# Patient Record
Sex: Male | Born: 1970 | Race: Black or African American | Hispanic: No | Marital: Married | Smoking: Never smoker
Health system: Southern US, Community
[De-identification: ages and names within clinical notes are randomized; demographics above are authoritative.]

## PROBLEM LIST (undated history)

## (undated) DIAGNOSIS — I10 Essential (primary) hypertension: Secondary | ICD-10-CM

## (undated) HISTORY — PX: BACK SURGERY: SHX140

## (undated) HISTORY — PX: OTHER SURGICAL HISTORY: SHX169

## (undated) HISTORY — PX: APPENDECTOMY: SHX54

## (undated) HISTORY — PX: ROTATOR CUFF REPAIR: SHX139

## (undated) HISTORY — PX: KNEE SURGERY: SHX244

---

## 2000-06-23 ENCOUNTER — Encounter: Payer: Self-pay | Admitting: Neurosurgery

## 2000-06-23 ENCOUNTER — Ambulatory Visit (HOSPITAL_COMMUNITY): Admission: RE | Admit: 2000-06-23 | Discharge: 2000-06-23 | Payer: Self-pay | Admitting: Neurosurgery

## 2001-05-20 ENCOUNTER — Encounter: Payer: Self-pay | Admitting: Neurosurgery

## 2001-05-21 ENCOUNTER — Inpatient Hospital Stay (HOSPITAL_COMMUNITY): Admission: RE | Admit: 2001-05-21 | Discharge: 2001-05-23 | Payer: Self-pay | Admitting: Neurosurgery

## 2001-05-21 ENCOUNTER — Encounter: Payer: Self-pay | Admitting: Neurosurgery

## 2001-06-13 ENCOUNTER — Encounter: Payer: Self-pay | Admitting: Neurosurgery

## 2001-06-13 ENCOUNTER — Encounter: Admission: RE | Admit: 2001-06-13 | Discharge: 2001-06-13 | Payer: Self-pay | Admitting: Neurosurgery

## 2002-05-27 ENCOUNTER — Ambulatory Visit (HOSPITAL_COMMUNITY): Admission: RE | Admit: 2002-05-27 | Discharge: 2002-05-27 | Payer: Self-pay | Admitting: Neurosurgery

## 2002-05-27 ENCOUNTER — Encounter: Payer: Self-pay | Admitting: Neurosurgery

## 2002-07-29 ENCOUNTER — Encounter: Payer: Self-pay | Admitting: Neurosurgery

## 2002-07-31 ENCOUNTER — Encounter: Payer: Self-pay | Admitting: Neurosurgery

## 2002-07-31 ENCOUNTER — Inpatient Hospital Stay (HOSPITAL_COMMUNITY): Admission: RE | Admit: 2002-07-31 | Discharge: 2002-08-07 | Payer: Self-pay | Admitting: Neurosurgery

## 2002-08-05 ENCOUNTER — Encounter: Payer: Self-pay | Admitting: Neurosurgery

## 2007-05-20 ENCOUNTER — Emergency Department (HOSPITAL_COMMUNITY): Admission: EM | Admit: 2007-05-20 | Discharge: 2007-05-20 | Payer: Self-pay | Admitting: Emergency Medicine

## 2008-03-18 ENCOUNTER — Emergency Department (HOSPITAL_COMMUNITY): Admission: EM | Admit: 2008-03-18 | Discharge: 2008-03-18 | Payer: Self-pay | Admitting: Emergency Medicine

## 2009-07-09 ENCOUNTER — Emergency Department (HOSPITAL_COMMUNITY): Admission: EM | Admit: 2009-07-09 | Discharge: 2009-07-09 | Payer: Self-pay | Admitting: Emergency Medicine

## 2011-01-04 LAB — CBC
Hemoglobin: 15.1 g/dL (ref 13.0–17.0)
MCV: 87.6 fL (ref 78.0–100.0)
RBC: 5.04 MIL/uL (ref 4.22–5.81)

## 2011-01-04 LAB — POCT CARDIAC MARKERS: Myoglobin, poc: 71.5 ng/mL (ref 12–200)

## 2011-01-04 LAB — DIFFERENTIAL
Eosinophils Relative: 1 % (ref 0–5)
Lymphocytes Relative: 23 % (ref 12–46)
Lymphs Abs: 1.3 10*3/uL (ref 0.7–4.0)
Monocytes Relative: 8 % (ref 3–12)
Neutro Abs: 4 10*3/uL (ref 1.7–7.7)
Neutrophils Relative %: 67 % (ref 43–77)

## 2011-01-04 LAB — COMPREHENSIVE METABOLIC PANEL
ALT: 20 U/L (ref 0–53)
AST: 20 U/L (ref 0–37)
Albumin: 4.5 g/dL (ref 3.5–5.2)
Alkaline Phosphatase: 61 U/L (ref 39–117)
BUN: 17 mg/dL (ref 6–23)
CO2: 25 mEq/L (ref 19–32)
Chloride: 106 mEq/L (ref 96–112)
Creatinine, Ser: 0.91 mg/dL (ref 0.4–1.5)
GFR calc Af Amer: >60 mL/min (ref >60)
Glucose, Bld: 101 mg/dL — ABNORMAL HIGH (ref 70–99)
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
## 2011-02-16 NOTE — Op Note (Signed)
[place]. [facility]  Patient:    TECUMSEH, YEAGLEY Visit Number: [id number] MRN: [id number]          Service Type: SUR Location: [id number] Attending Physician:  Emeterio Reeve Proc. Date: 05/21/01 Adm. Date:  05/21/2001                             Operative Report  PREOPERATIVE DIAGNOSIS:  Herniated disk at C4-5 on the left.  POSTOPERATIVE DIAGNOSIS:  Herniated disk at C4-5 on the left.  OPERATION PERFORMED:  C4-5 anterior cervical diskectomy and tether plate.  SURGEON:  Payton Doughty, M.D.  ASSISTANT:  Mena Goes. Franky Macho, M.D.  COMPLICATIONS:  None.  ANESTHESIA:  General endotracheal.  INDICATIONS FOR PROCEDURE:  The patient is a 40 year old right-handed black male with herniated disk at C4-C5.  DESCRIPTION OF PROCEDURE:  The patient was taken to the operating room, smoothly anesthetized and intubated, and placed supine in the halter head traction.  Following shave, prep and drape in the usual sterile fashion, the skin was incised in the midline to the medial border of the sternocleidomastoid muscle on the left side one fingerbreadth above the level of the carotid tubercle.  The platysma was identified and elevated, divided and undermined.  The sternocleidomastoid was identified.  Medial dissection revealed the carotid artery to retracted laterally to the left, trachea and esophagus retracted laterally divided exposing the bones of the anterior cervical spine.  Marker was placed and intraoperative x-ray obtained to confirm correctness of the level.  Having confirmed correctness of the level, diskectomy was carried out under gross observation.  The operating microscope was then brought in and used for microdissection technique.  The anterior epidural space was dissected, diskectomy performed in the anterior epidural space.  On the left side there was a large vein which was divided and packed and the left C5 nerve root was carefully dissected,  the offending disk particle removed.  The right side similar vein but now evidence of a large disk on that side.  Following complete diskectomy, a 7 mm bone graft was fashioned from patellar allograft and tapped into place.  The wound was irrigated and hemostasis assured.  A 12 mm tether plate was then placed with two 13 mm screws in C5, one 13 and one 12 mm screw in C4.  Intraoperative X-ray showed good placement of bone graft, plate and screws.  The wound was irrigated, hemostasis assured.  The platysma was reapproximated with 3-0 Vicryl in interrupted fashion and subcutaneous tissues were reapproximated with 3-0 Vicryl in interrupted fashion.  The skin was closed with 4-0 Vicryl in a running subcuticular fashion.  Benzoin and Steri-Strips were placed and made occlusive with Telfa and OpSite.  The patient was taken to the recovery room in good condition after being placed in an Aspen collar. Attending Physician:  Emeterio Reeve DD:  05/21/01 TD:  05/21/01 Job: [id number]

## 2011-02-16 NOTE — Discharge Summary (Signed)
   NAME:  Matthew [name], Matthew [name]                        ACCOUNT NO.:  [id number]   MEDICAL RECORD NO.:  [id number]                   PATIENT TYPE:  INP   LOCATION:  3011                                 FACILITY:  [facility]   PHYSICIAN:  Payton Doughty, M.D.                   DATE OF BIRTH:  [birth year]   DATE OF ADMISSION:  07/31/2002  DATE OF DISCHARGE:  08/07/2002                                 DISCHARGE SUMMARY   ADMISSION DIAGNOSIS:  Spondylolisthesis C3-4, herniated disk C5-6 and status  post C4-5 fusion.   PROCEDURE:  C3-4, C5-6 anterior cervicectomy and fusion.   COMPLICATIONS:  Dysphagia.   DISPOSITION:  Discharge status alive and well.   HISTORY OF PRESENT ILLNESS:  This is a 40 year old right-handed black  gentleman whose history and physical is recounted in the chart. He has had a  4-5 disk done. He developed transitional  syndrome to the 3-4 and  at 5-6  and is now admitted for fusion.   PAST MEDICAL HISTORY:  Unremarkable.   PHYSICAL EXAMINATION:  GENERAL: The exam was intact.  NEUROLOGIC:  Demonstrated mild C6 radiculopathy.   HOSPITAL COURSE:  He was admitted after ascertaining normal laboratory  values and underwent removal of the 4-5 plate and fusion at the C3-4 and C5-  6 and plating from C3 to C6. Postoperatively he has done reasonably well and  a drain  was left in for two days. He had some dysphagia from phalangeal  swelling after that. He responded well to a short burst of steroids.   As time passed he has been able to drink normally. He did have an IV  restarted to prevent dehydration for several days. He is now up eating and  voiding normally. His voice is good. His airway is OK. Swallowing has  returned to near normal. Lateral C-spine x-ray showed some swelling but  intact. No evidence of free air. He is now being discharged home in the care  of his family.   DISCHARGE MEDICATIONS:  Percocet p.r.n. pain.   FOLLOW UP:  Followup will be in the  [facility]  office in about a week with another lateral C-spine x-ray.                                               Payton Doughty, M.D.    MWR/MEDQ  D:  08/07/2002  T:  08/08/2002  Job:  [id number]

## 2011-02-16 NOTE — Op Note (Signed)
NAME:  Matthew [name], Matthew [name] NO.:  [id number]   MEDICAL RECORD NO.:  [id number]                   PATIENT TYPE:  INP   LOCATION:  2871                                 FACILITY:  [facility]   PHYSICIAN:  Payton Doughty, M.D.                   DATE OF BIRTH:  1971-09-25   DATE OF PROCEDURE:  07/31/2002  DATE OF DISCHARGE:                                 OPERATIVE REPORT   PREOPERATIVE DIAGNOSES:  Spondylosis C3-4,  herniated disc at C5-C6.   POSTOPERATIVE DIAGNOSES:  Spondylosis C3-4,  herniated disc at C5-C6.   OPERATION PERFORMED:  C3-4 and C5-6 anterior cervical diskectomy and fusion  with a tether plate removing Y8-6 tether plate and anterior arthrodesis from  C3 to C6.   SURGEON:  Payton Doughty, M.D.   ANESTHESIA:  General endotracheal.   PREP:  Sterile Betadine prep and scrub with alcohol wipe.   ASSISTANT:  1. Danae Orleans. Venetia Maxon, M.D.  2. [place].   INDICATIONS FOR PROCEDURE:  The patient is a 40 year old gentleman who has  had a previous fusion at C4-C5 and now has spondylitic disease at 3-4 and a  herniated disk at C5-C6.   DESCRIPTION OF PROCEDURE:  The patient was taken to the operating room,  smoothly anesthetized and intubated and placed supine on the operating table  in the halter head traction.  Following shave, prep and draped in the usual  sterile fashion.  The skin was incised paralleling the sternocleidomastoid  muscle for a distance of approximately 8 cm, starting at the level of the  carotid tubercle.  The platysma was identified, elevated, divided and  undermined.  The sternocleidomastoid muscle was identified and medial  dissection revealed the carotid artery retracted laterally to the left, the  trachea and esophagus dissected free and retracted laterally to the right.  This exposed the bones of the anterior cervical spine and the plate.  Using  the plate as a marker, the longus colli was taken down from C6 up through C3  bilaterally  and the Shadowline retractor placed transversely in a  cephalocaudal direction.  Diskectomy was then carried out at  C3-4 and C5-6  first under gross observation.  The operating microscope was then brought in  and we used microdissection technique to remove the posterior longitudinal  ligament dissecting into the anterior epidural space and ensured  decompression of the nerve roots.  At C3-4 there was mostly spondylitic  disease that was a little bit worse on the left than on the right but  complete decompression was obtained.  At C5-6 there was a herniated disk  centrally and to the right which was removed without difficulty.  At each  both neural foramina were completely decompressed.  Epidural bleeding was  controlled with thrombin soaked Gelfoam on the right side at C3-C4.  Having  completed decompression, patellar bone graft was then fashioned and tapped  into place.  The wound was irrigated and hemostasis assured.  A four level  55 mm tether plate was then placed with 13 mm screws, two in C3, one in C4,  one in C5 and two in C6.  Intraoperative x-ray showed good placement of bone  grafts, plate and screws.  The wound was irrigated and hemostasis assured.  A Jackson-Pratt drain was placed in the posterior cervical space and exited  by separate incision, secured there with 3-0 Vicryl.  The platysma was  reapproximated with 3-0 Vicryl in interrupted fashion, subcutaneous tissues  reapproximated with 3-0  Vicryl in interrupted fashion and the skin was closed with 4-0 Vicryl in  running subcuticular fashion.  Benzoin and Steri-Strips were placed and made  occlusive with Telfa and OpSite.  The patient was placed in an Aspen collar  and returned to the recovery room in good condition.                                                 Payton Doughty, M.D.    MWR/MEDQ  D:  07/31/2002  T:  07/31/2002  Job:  [id number]
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
## 2012-12-16 ENCOUNTER — Emergency Department (HOSPITAL_COMMUNITY): Payer: BC Managed Care – PPO

## 2012-12-16 ENCOUNTER — Encounter (HOSPITAL_COMMUNITY): Payer: Self-pay

## 2012-12-16 ENCOUNTER — Emergency Department (HOSPITAL_COMMUNITY)
Admission: EM | Admit: 2012-12-16 | Discharge: 2012-12-16 | Disposition: A | Discharge status: Home / Self Care | Payer: BC Managed Care – PPO | Attending: Emergency Medicine | Admitting: Emergency Medicine

## 2012-12-16 DIAGNOSIS — S40012A Contusion of left shoulder, initial encounter: Secondary | ICD-10-CM

## 2012-12-16 DIAGNOSIS — Y9389 Activity, other specified: Secondary | ICD-10-CM | POA: Insufficient documentation

## 2012-12-16 DIAGNOSIS — S161XXA Strain of muscle, fascia and tendon at neck level, initial encounter: Secondary | ICD-10-CM

## 2012-12-16 DIAGNOSIS — S4980XA Other specified injuries of shoulder and upper arm, unspecified arm, initial encounter: Secondary | ICD-10-CM | POA: Insufficient documentation

## 2012-12-16 DIAGNOSIS — S46909A Unspecified injury of unspecified muscle, fascia and tendon at shoulder and upper arm level, unspecified arm, initial encounter: Secondary | ICD-10-CM | POA: Insufficient documentation

## 2012-12-16 DIAGNOSIS — S8990XA Unspecified injury of unspecified lower leg, initial encounter: Secondary | ICD-10-CM | POA: Insufficient documentation

## 2012-12-16 DIAGNOSIS — Y92410 Unspecified street and highway as the place of occurrence of the external cause: Secondary | ICD-10-CM | POA: Insufficient documentation

## 2012-12-16 DIAGNOSIS — S40019A Contusion of unspecified shoulder, initial encounter: Secondary | ICD-10-CM | POA: Insufficient documentation

## 2012-12-16 DIAGNOSIS — S139XXA Sprain of joints and ligaments of unspecified parts of neck, initial encounter: Secondary | ICD-10-CM | POA: Insufficient documentation

## 2012-12-16 DIAGNOSIS — S0990XA Unspecified injury of head, initial encounter: Secondary | ICD-10-CM | POA: Insufficient documentation

## 2012-12-16 DIAGNOSIS — I10 Essential (primary) hypertension: Secondary | ICD-10-CM | POA: Insufficient documentation

## 2012-12-16 DIAGNOSIS — Z79899 Other long term (current) drug therapy: Secondary | ICD-10-CM | POA: Insufficient documentation

## 2012-12-16 HISTORY — DX: Essential (primary) hypertension: I10

## 2012-12-16 LAB — COMPREHENSIVE METABOLIC PANEL
Alkaline Phosphatase: 65 U/L (ref 39–117)
Calcium: 9.4 mg/dL (ref 8.4–10.5)
GFR calc Af Amer: >90 mL/min (ref >90)
GFR calc non Af Amer: >90 mL/min (ref >90)
Glucose, Bld: 99 mg/dL (ref 70–99)
Sodium: 140 mEq/L (ref 135–145)
Total Bilirubin: 0.4 mg/dL (ref 0.3–1.2)
Total Protein: 7 g/dL (ref 6.0–8.3)

## 2012-12-16 LAB — CBC WITH DIFFERENTIAL/PLATELET
Basophils Relative: 0 % (ref 0–1)
Eosinophils Relative: 1 % (ref 0–5)
Hemoglobin: 14.5 g/dL (ref 13.0–17.0)
MCH: 29.4 pg (ref 26.0–34.0)
MCHC: 35 g/dL (ref 30.0–36.0)
Monocytes Absolute: 0.5 10*3/uL (ref 0.1–1.0)
Monocytes Relative: 9 % (ref 3–12)
RBC: 4.93 MIL/uL (ref 4.22–5.81)
RDW: 13.2 % (ref 11.5–15.5)

## 2012-12-16 LAB — URINALYSIS, ROUTINE W REFLEX MICROSCOPIC
Bilirubin Urine: NEGATIVE
Glucose, UA: NEGATIVE mg/dL
Hgb urine dipstick: NEGATIVE
Ketones, ur: NEGATIVE mg/dL
Leukocytes, UA: NEGATIVE
Nitrite: NEGATIVE
Protein, ur: NEGATIVE mg/dL
Specific Gravity, Urine: 1.01 (ref 1.005–1.030)
Urobilinogen, UA: 0.2 mg/dL (ref 0.0–1.0)
pH: 8 (ref 5.0–8.0)

## 2012-12-16 MED ORDER — TRAMADOL HCL 50 MG PO TABS: 50.0000 mg | ORAL_TABLET | Freq: Four times a day (QID) | ORAL | Status: DC | PRN

## 2012-12-16 MED ORDER — IOHEXOL 300 MG/ML  SOLN
100.0000 mL | Freq: Once | INTRAMUSCULAR | Status: AC | PRN
  Administered 2012-12-16: 100 mL via INTRAVENOUS
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
## 2012-12-16 NOTE — ED Notes (Signed)
Advised patient we needed urine specimen. 
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
## 2012-12-16 NOTE — ED Notes (Signed)
Left in c/o family for transport home; alert, oriented, in no apparent distress; instructions, prescriptions and f/u information given/reviewed - verbalizes understanding.
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
## 2016-08-24 ENCOUNTER — Emergency Department (HOSPITAL_COMMUNITY)
Admission: EM | Admit: 2016-08-24 | Discharge: 2016-08-25 | Disposition: A | Discharge status: Home / Self Care | Payer: BC Managed Care – PPO | Attending: Emergency Medicine | Admitting: Emergency Medicine

## 2016-08-24 ENCOUNTER — Encounter (HOSPITAL_COMMUNITY): Payer: Self-pay

## 2016-08-24 DIAGNOSIS — I10 Essential (primary) hypertension: Secondary | ICD-10-CM | POA: Diagnosis not present

## 2016-08-24 DIAGNOSIS — S4992XA Unspecified injury of left shoulder and upper arm, initial encounter: Secondary | ICD-10-CM | POA: Diagnosis present

## 2016-08-24 DIAGNOSIS — Y999 Unspecified external cause status: Secondary | ICD-10-CM | POA: Insufficient documentation

## 2016-08-24 DIAGNOSIS — S0990XA Unspecified injury of head, initial encounter: Secondary | ICD-10-CM | POA: Insufficient documentation

## 2016-08-24 DIAGNOSIS — T148XXA Other injury of unspecified body region, initial encounter: Secondary | ICD-10-CM | POA: Insufficient documentation

## 2016-08-24 DIAGNOSIS — R1012 Left upper quadrant pain: Secondary | ICD-10-CM | POA: Diagnosis not present

## 2016-08-24 DIAGNOSIS — Y92410 Unspecified street and highway as the place of occurrence of the external cause: Secondary | ICD-10-CM | POA: Diagnosis not present

## 2016-08-24 DIAGNOSIS — Y9389 Activity, other specified: Secondary | ICD-10-CM | POA: Insufficient documentation

## 2016-08-24 DIAGNOSIS — R1033 Periumbilical pain: Secondary | ICD-10-CM | POA: Diagnosis not present

## 2016-08-24 DIAGNOSIS — M542 Cervicalgia: Secondary | ICD-10-CM | POA: Insufficient documentation

## 2016-08-24 DIAGNOSIS — M549 Dorsalgia, unspecified: Secondary | ICD-10-CM | POA: Insufficient documentation

## 2016-08-24 DIAGNOSIS — J398 Other specified diseases of upper respiratory tract: Secondary | ICD-10-CM | POA: Insufficient documentation

## 2016-08-24 DIAGNOSIS — Z79899 Other long term (current) drug therapy: Secondary | ICD-10-CM | POA: Insufficient documentation

## 2016-08-24 DIAGNOSIS — T07XXXA Unspecified multiple injuries, initial encounter: Secondary | ICD-10-CM

## 2016-08-24 MED ORDER — HYDROCODONE-ACETAMINOPHEN 5-325 MG PO TABS
2.0000 | ORAL_TABLET | Freq: Once | ORAL | Status: AC
  Administered 2016-08-25: 2 via ORAL
  Filled 2016-08-24: qty 2

## 2016-08-24 MED ORDER — IBUPROFEN 800 MG PO TABS
800.0000 mg | ORAL_TABLET | Freq: Once | ORAL | Status: AC
  Administered 2016-08-25: 800 mg via ORAL
  Filled 2016-08-24: qty 1
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
## 2016-08-25 ENCOUNTER — Emergency Department (HOSPITAL_COMMUNITY): Payer: BC Managed Care – PPO

## 2016-08-25 DIAGNOSIS — T148XXA Other injury of unspecified body region, initial encounter: Secondary | ICD-10-CM | POA: Diagnosis not present

## 2016-08-25 LAB — I-STAT CHEM 8, ED
BUN: 17 mg/dL (ref 6–20)
CALCIUM ION: 1.17 mmol/L (ref 1.15–1.40)
CHLORIDE: 104 mmol/L (ref 101–111)
Creatinine, Ser: 1 mg/dL (ref 0.61–1.24)
GLUCOSE: 123 mg/dL — AB (ref 65–99)
HCT: 46 % (ref 39.0–52.0)
HEMOGLOBIN: 15.6 g/dL (ref 13.0–17.0)
POTASSIUM: 3.9 mmol/L (ref 3.5–5.1)
SODIUM: 141 mmol/L (ref 135–145)
TCO2: 25 mmol/L (ref 0–100)

## 2016-08-25 MED ORDER — IBUPROFEN 800 MG PO TABS: 800.0000 mg | ORAL_TABLET | Freq: Three times a day (TID) | ORAL | 0 refills | Status: DC

## 2016-08-25 MED ORDER — IOPAMIDOL (ISOVUE-300) INJECTION 61%
100.0000 mL | Freq: Once | INTRAVENOUS | Status: AC | PRN
  Administered 2016-08-25: 100 mL via INTRAVENOUS

## 2016-08-25 NOTE — Discharge Instructions (Signed)
Your testing is negative for serious injury. Take ibuprofen as needed for aches and pains. Followup with your doctor. Return to the ED if you develop new or worsening symptoms.

## 2016-08-25 NOTE — ED Notes (Signed)
Ambulated patient around nurses station with no issues.

## 2018-12-02 ENCOUNTER — Encounter (HOSPITAL_COMMUNITY): Payer: Self-pay | Admitting: Emergency Medicine

## 2018-12-02 ENCOUNTER — Emergency Department (HOSPITAL_COMMUNITY)
Admission: EM | Admit: 2018-12-02 | Discharge: 2018-12-02 | Disposition: A | Discharge status: Home / Self Care | Payer: No Typology Code available for payment source | Attending: Emergency Medicine | Admitting: Emergency Medicine

## 2018-12-02 ENCOUNTER — Emergency Department (HOSPITAL_COMMUNITY): Payer: No Typology Code available for payment source

## 2018-12-02 ENCOUNTER — Other Ambulatory Visit: Payer: Self-pay

## 2018-12-02 DIAGNOSIS — S46912A Strain of unspecified muscle, fascia and tendon at shoulder and upper arm level, left arm, initial encounter: Secondary | ICD-10-CM | POA: Diagnosis not present

## 2018-12-02 DIAGNOSIS — I10 Essential (primary) hypertension: Secondary | ICD-10-CM | POA: Insufficient documentation

## 2018-12-02 DIAGNOSIS — Z79899 Other long term (current) drug therapy: Secondary | ICD-10-CM | POA: Insufficient documentation

## 2018-12-02 DIAGNOSIS — Y9389 Activity, other specified: Secondary | ICD-10-CM | POA: Diagnosis not present

## 2018-12-02 DIAGNOSIS — Y929 Unspecified place or not applicable: Secondary | ICD-10-CM | POA: Diagnosis not present

## 2018-12-02 DIAGNOSIS — M542 Cervicalgia: Secondary | ICD-10-CM | POA: Diagnosis not present

## 2018-12-02 DIAGNOSIS — W208XXA Other cause of strike by thrown, projected or falling object, initial encounter: Secondary | ICD-10-CM | POA: Insufficient documentation

## 2018-12-02 DIAGNOSIS — Y990 Civilian activity done for income or pay: Secondary | ICD-10-CM | POA: Insufficient documentation

## 2018-12-02 DIAGNOSIS — S4992XA Unspecified injury of left shoulder and upper arm, initial encounter: Secondary | ICD-10-CM | POA: Diagnosis present

## 2018-12-02 MED ORDER — HYDROCODONE-ACETAMINOPHEN 5-325 MG PO TABS: 2.0000 | ORAL_TABLET | ORAL | 0 refills | Status: DC | PRN

## 2018-12-02 MED ORDER — METHOCARBAMOL 500 MG PO TABS: 500.0000 mg | ORAL_TABLET | Freq: Two times a day (BID) | ORAL | 0 refills | Status: DC

## 2018-12-02 MED ORDER — PREDNISONE 10 MG PO TABS: ORAL_TABLET | ORAL | 0 refills | Status: DC

## 2018-12-02 NOTE — ED Provider Notes (Signed)
[facility] EMERGENCY DEPARTMENT Provider Note   CSN: [id number] Arrival date & time: 12/02/18  1051    History   Chief Complaint Chief Complaint  Patient presents with  . Shoulder Pain    HPI Matthew [name] is a 48 y.o. male.     The history is provided by the patient. No language interpreter was used.  Shoulder Pain  Location:  Shoulder Shoulder location:  L shoulder Injury: yes   Associated symptoms: neck pain     Past Medical History:  Diagnosis Date  . Hypertension     There are no active problems to display for this patient.   Past Surgical History:  Procedure Laterality Date  . APPENDECTOMY    . BACK SURGERY    . KNEE SURGERY    . ROTATOR CUFF REPAIR    . thumb surgery          Home Medications    Prior to Admission medications   Medication Sig Start Date End Date Taking? Authorizing Provider  celecoxib (CELEBREX) 200 MG capsule Take 200 mg by mouth daily.     [provider]  ibuprofen (ADVIL,MOTRIN) 800 MG tablet Take 1 tablet (800 mg total) by mouth 3 (three) times daily. 08/25/16   Rancour, Jeannett Senior, MD  UNKNOWN TO PATIENT Take 1 tablet by mouth daily. For blood pressure-name is unknown (may start with "M')    [provider]    Family History History reviewed. No pertinent family history.  Social History Social History   Tobacco Use  . Smoking status: Never Smoker  . Smokeless tobacco: Never Used  Substance Use Topics  . Alcohol use: No  . Drug use: No     Allergies   Other   Review of Systems Review of Systems  Musculoskeletal: Positive for arthralgias, myalgias and neck pain.  All other systems reviewed and are negative.    Physical Exam Updated Vital Signs BP (!) 155/100 (BP Location: Right Arm)   Pulse 78   Temp 97.9 F (36.6 C) (Temporal)   Resp 18   Ht 6' (1.829 m)   Wt 102.1 kg   SpO2 95%   BMI 30.52 kg/m   Physical Exam Vitals signs reviewed.  HENT:     Head: Normocephalic.  Neck:      Musculoskeletal: Normal range of motion. Muscular tenderness present.  Cardiovascular:     Rate and Rhythm: Normal rate.     Pulses: Normal pulses.  Pulmonary:     Effort: Pulmonary effort is normal.  Musculoskeletal:     Comments: c spine tender left lateral neck,  Tender left shoulder, pain with movement.   Skin:    General: Skin is warm.  Neurological:     General: No focal deficit present.     Mental Status: He is alert.  Psychiatric:        Mood and Affect: Mood normal.      ED Treatments / Results  Labs (all labs ordered are listed, but only abnormal results are displayed) Labs Reviewed - No data to display  EKG None  Radiology Dg Cervical Spine Complete  Result Date: 12/02/2018 CLINICAL DATA:  Fall with injury to neck with left shoulder and neck pain. EXAM: CERVICAL SPINE - COMPLETE 4+ VIEW COMPARISON:  CT 08/25/2016 FINDINGS: Vertebral body alignment and heights are normal. Mild spondylosis of the cervical spine. Anterior fusion hardware is present and intact from C3-C6. Interbody fusion from C3-C6. Mild left-sided neural foraminal narrowing at the C3-4 level  and C6-7 level. Mild uncovertebral joint spurring and facet arthropathy is present. Atlantoaxial articulation is normal. Prevertebral soft tissues are unremarkable IMPRESSION: No acute findings. Mild spondylosis of the cervical spine. Anterior fusion hardware intact unchanged from C3-C6 with interbody fusion from C3-C6. Mild left-sided neural foraminal narrowing at the C3-4 and C6-7 levels. Electronically Signed   By: Elberta Fortis M.D.   On: 12/02/2018 13:29   Dg Shoulder Left  Result Date: 12/02/2018 CLINICAL DATA:  Fall with left shoulder pain. EXAM: LEFT SHOULDER - 2+ VIEW COMPARISON:  12/16/2012 and chest x-ray 08/25/2016 FINDINGS: Subtle early degenerative change over the glenohumeral joint. Minimal degenerative change over the [facility] joint. Prominent downgoing spur over the distal acromion unchanged. Stable  deformity of the upper left bony thorax. IMPRESSION: No acute findings. Mild degenerate changes of the left shoulder. Prominent downgoing spur over the a chromium unchanged. Electronically Signed   By: Elberta Fortis M.D.   On: 12/02/2018 13:31    Procedures Procedures (including critical care time)  Medications Ordered in ED Medications - No data to display   Initial Impression / Assessment and Plan / ED Course  I have reviewed the triage vital signs and the nursing notes.  Pertinent labs & imaging results that were available during my care of the patient were reviewed by me and considered in my medical decision making (see chart for details).        MDM  xrays reviewed   Final Clinical Impressions(s) / ED Diagnoses   Final diagnoses:  Strain of left shoulder, initial encounter  Neck pain on left side  Neck pain    ED Discharge Orders         Ordered    predniSONE (DELTASONE) 10 MG tablet     12/02/18 1429    methocarbamol (ROBAXIN) 500 MG tablet  2 times daily     12/02/18 1429    HYDROcodone-acetaminophen (NORCO/VICODIN) 5-325 MG tablet  Every 4 hours PRN     12/02/18 1429        An After Visit Summary was printed and given to the patient.    Elson Areas, PA-C 12/02/18 1434    Samuel Jester, DO 12/06/18 1845

## 2018-12-02 NOTE — ED Triage Notes (Signed)
Pt states he was getting a box of napkins off a higher shelf when it fell pushing his neck to the side. Now having L shoulder and neck pain.

## 2018-12-08 ENCOUNTER — Encounter (HOSPITAL_COMMUNITY): Payer: Self-pay | Admitting: Emergency Medicine

## 2018-12-08 ENCOUNTER — Telehealth: Payer: Self-pay | Admitting: Orthopaedic Surgery

## 2018-12-08 ENCOUNTER — Other Ambulatory Visit: Payer: Self-pay

## 2018-12-08 ENCOUNTER — Emergency Department (HOSPITAL_COMMUNITY)
Admission: EM | Admit: 2018-12-08 | Discharge: 2018-12-08 | Disposition: A | Discharge status: Home / Self Care | Payer: No Typology Code available for payment source | Attending: Emergency Medicine | Admitting: Emergency Medicine

## 2018-12-08 DIAGNOSIS — Z760 Encounter for issue of repeat prescription: Secondary | ICD-10-CM | POA: Insufficient documentation

## 2018-12-08 DIAGNOSIS — Z7982 Long term (current) use of aspirin: Secondary | ICD-10-CM | POA: Diagnosis not present

## 2018-12-08 DIAGNOSIS — M5412 Radiculopathy, cervical region: Secondary | ICD-10-CM | POA: Insufficient documentation

## 2018-12-08 DIAGNOSIS — M542 Cervicalgia: Secondary | ICD-10-CM | POA: Diagnosis present

## 2018-12-08 DIAGNOSIS — Z79899 Other long term (current) drug therapy: Secondary | ICD-10-CM | POA: Insufficient documentation

## 2018-12-08 DIAGNOSIS — I10 Essential (primary) hypertension: Secondary | ICD-10-CM | POA: Insufficient documentation

## 2018-12-08 DIAGNOSIS — M62838 Other muscle spasm: Secondary | ICD-10-CM | POA: Diagnosis not present

## 2018-12-08 MED ORDER — HYDROCODONE-ACETAMINOPHEN 5-325 MG PO TABS: 1.0000 | ORAL_TABLET | ORAL | 0 refills | Status: DC | PRN

## 2018-12-08 MED ORDER — METHOCARBAMOL 750 MG PO TABS: 750.0000 mg | ORAL_TABLET | Freq: Four times a day (QID) | ORAL | 0 refills | Status: DC

## 2018-12-08 MED ORDER — IBUPROFEN 800 MG PO TABS: 800.0000 mg | ORAL_TABLET | Freq: Three times a day (TID) | ORAL | 0 refills | Status: DC

## 2018-12-08 NOTE — ED Provider Notes (Signed)
[facility] EMERGENCY DEPARTMENT Provider Note   CSN: [id number] Arrival date & time: 12/08/18  1104    History   Chief Complaint Chief Complaint  Patient presents with  . Medication Refill    HPI Matthew [name] is a 48 y.o. male with a recent history of left neck and shoulder injury occurring at work when a heavy box fell, landing in his shoulder causing a rightward whiplash type injury to his neck.  He reports history of prior cervical fusions by Dr. Channing Mutters and is trying to obtain office follow up with him, but is complicated by workmans comp issues and that Dr Channing Mutters is retiring soon.  The patient has complaint of increasing pain since completing his hydrocodone he received here at his initial evaluation of this injury.  He reports waking with numbness radiating into his left fingertips most most morning with intermittent episodes of numbness throughout the day and painful range of motion of his neck and shoulder along with persistent swelling along his left neck and shoulder. He denies weakness in the left hand or arm.     The history is provided by the patient.    Past Medical History:  Diagnosis Date  . Hypertension     There are no active problems to display for this patient.   Past Surgical History:  Procedure Laterality Date  . APPENDECTOMY    . BACK SURGERY    . KNEE SURGERY    . ROTATOR CUFF REPAIR    . thumb surgery          Home Medications    Prior to Admission medications   Medication Sig Start Date End Date Taking? Authorizing Provider  aspirin EC 81 MG tablet Take 81 mg by mouth daily.   Yes [provider]  metoprolol succinate (TOPROL-XL) 25 MG 24 hr tablet Take 25 mg by mouth daily. Take 1 tablet po daily.   Yes [provider]  predniSONE (DELTASONE) 10 MG tablet 6,5,4,3,2,1 taper 12/02/18  Yes Elson Areas, PA-C  HYDROcodone-acetaminophen (NORCO/VICODIN) 5-325 MG tablet Take 1 tablet by mouth every 4 (four) hours as needed. 12/08/18    Burgess Amor, PA-C  ibuprofen (ADVIL,MOTRIN) 800 MG tablet Take 1 tablet (800 mg total) by mouth 3 (three) times daily. 12/08/18   Burgess Amor, PA-C  methocarbamol (ROBAXIN-750) 750 MG tablet Take 1 tablet (750 mg total) by mouth 4 (four) times daily. 12/08/18   Burgess Amor, PA-C    Family History No family history on file.  Social History Social History   Tobacco Use  . Smoking status: Never Smoker  . Smokeless tobacco: Never Used  Substance Use Topics  . Alcohol use: No  . Drug use: No     Allergies   Other   Review of Systems Review of Systems  Constitutional: Negative for fever.  Musculoskeletal: Positive for arthralgias and joint swelling. Negative for myalgias.  Neurological: Positive for numbness. Negative for weakness and headaches.     Physical Exam Updated Vital Signs BP (!) 150/84   Pulse 65   Temp 98.4 F (36.9 C) (Oral)   Resp 18   Ht 6' (1.829 m)   Wt 102 kg   SpO2 100%   BMI 30.50 kg/m   Physical Exam Constitutional:      Appearance: He is well-developed.  HENT:     Head: Atraumatic.  Neck:     Musculoskeletal: Muscular tenderness present.  Cardiovascular:     Comments: Pulses equal bilaterally Musculoskeletal:  General: Tenderness present.     Cervical back: He exhibits decreased range of motion, tenderness and spasm.       Back:     Comments: ttp and spasm across left trapezius  Skin:    General: Skin is warm and dry.  Neurological:     Mental Status: He is alert.     Sensory: Sensory deficit present.     Deep Tendon Reflexes: Reflexes normal.     Reflex Scores:      Bicep reflexes are 2+ on the right side and 2+ on the left side.    Comments: Decreased sensation to sharp touchulnar distribution of hand and lateral forearm. Grip strength 5/5 right , 4.5/5 left.    thumb/finger coordination intact.  Strength 5/5 wrist and elbow ext/flex.   ED Treatments / Results  Labs (all labs ordered are listed, but only abnormal results  are displayed) Labs Reviewed - No data to display  EKG None  Radiology No results found.  Procedures Procedures (including critical care time)  Medications Ordered in ED Medications - No data to display   Initial Impression / Assessment and Plan / ED Course  I have reviewed the triage vital signs and the nursing notes.  Pertinent labs & imaging results that were available during my care of the patient were reviewed by me and considered in my medical decision making (see chart for details).        Review of prior visit and imaging. No indication for emergent MRI imaging today, but discussed with pt that he may need this test if sx do not improve with time and continued tx.  He was placed on ibu, as just completed pred taper. Robaxin, hydrocodone. Referral to Dr. Channing Mutters for further management (pt called his office while here and has arranged f/u).   Pt seen by Dr. Jodi Mourning  Prior to dc home.  Final Clinical Impressions(s) / ED Diagnoses   Final diagnoses:  Cervical radiculopathy due to trauma  Muscle spasm    ED Discharge Orders         Ordered    ibuprofen (ADVIL,MOTRIN) 800 MG tablet  3 times daily     12/08/18 1432    methocarbamol (ROBAXIN-750) 750 MG tablet  4 times daily     12/08/18 1432    HYDROcodone-acetaminophen (NORCO/VICODIN) 5-325 MG tablet  Every 4 hours PRN     12/08/18 1432           Burgess Amor, PA-C 12/10/18 1001    Blane Ohara, MD 12/11/18 [phone]

## 2018-12-08 NOTE — ED Notes (Signed)
ED Provider at bedside. 

## 2018-12-08 NOTE — Telephone Encounter (Signed)
Call received this afternoon, 12/08/18, from workers Designer, industrial/product at [employer], [phone]. Requests appointment for patient following emergency room visit at [facility] 12/02/18 for work-related injury, shoulder strain. I called back to adjuster and requested fax#, in order to send our worker's comp set up form. Upon form completed and received, we will schedule appointment.

## 2018-12-08 NOTE — ED Triage Notes (Signed)
Pt would like refill on pain medications he received on 03/03.  He is not able to get in to a Dr d/t his workers comp claim has not gone through yet.

## 2018-12-08 NOTE — Discharge Instructions (Addendum)
T Do not drive within 4 hours of taking hydrocodone as this will make you drowsy.  Avoid lifting,  Bending,  Twisting or any other activity that worsens your pain over the next week.  Apply a heating pad to your neck and left shoulder area for 20 minutes several times daily.  This may help improve the muscle spasm.  You should get rechecked if your symptoms are not improving or you develop increased pain, weakness or loss of function in your left arm or hand - this is a sign of a worsening condition.

## 2018-12-09 NOTE — Telephone Encounter (Signed)
Spoke with patient and with adjuster - claim information verified, regarding appointment; discussed tomorrow - adjuster asked for next available after tomorrow; therefore, scheduled Friday, 12/12/2018, 11:00am; aware. (contact information: [facility], [id number], [place], [place]  [address], ph# [phone] / fax# [phone]

## 2018-12-12 ENCOUNTER — Encounter: Payer: Self-pay | Admitting: Orthopedic Surgery

## 2018-12-12 ENCOUNTER — Ambulatory Visit (INDEPENDENT_AMBULATORY_CARE_PROVIDER_SITE_OTHER): Payer: No Typology Code available for payment source | Admitting: Orthopedic Surgery

## 2018-12-12 ENCOUNTER — Telehealth: Payer: Self-pay | Admitting: Radiology

## 2018-12-12 ENCOUNTER — Other Ambulatory Visit: Payer: Self-pay

## 2018-12-12 VITALS — BP 158/98 | HR 67 | Ht 72.0 in | Wt 218.0 lb

## 2018-12-12 DIAGNOSIS — M542 Cervicalgia: Secondary | ICD-10-CM | POA: Diagnosis not present

## 2018-12-12 DIAGNOSIS — M4722 Other spondylosis with radiculopathy, cervical region: Secondary | ICD-10-CM | POA: Diagnosis not present

## 2018-12-12 NOTE — Progress Notes (Signed)
ESTABLISHED PATIENT NEW PROBLEM OFFICE VISIT  Chief Complaint  Patient presents with  . Shoulder Injury    left 12/01/2018 boxes of pater towels fell on his left shoulder and neck  . Neck Pain    48 year old male status post C 3-C7 fusion years ago after a injury at [facility] presents with neck and left shoulder pain which started on December 01, 2018.  The patient says he was getting a set of 4 cases of large boxes filled with paper towels from a cabinet and they fell he avoided 2 of them but the other 2 caught the side of his neck and left shoulder causing acute onset of severe pain.  He has been to the emergency room on 2 occasions the first 1 for the injury the second 1 when he ran out of medication.  He complains of severe pain a catching sensation in the left side of his cervical spine, sharp pain that runs down his left arm into his left hand which goes numb.  He says he cannot sleep in a bed he has to sleep in a recliner.  He is on hydrocodone ibuprofen and Robaxin 5, 800, 750 mg respectively and has not noted any improvement.  He says he cannot really move his left shoulder without acute pain into his trapezius muscle and cervical spine.  The pain is constant and unremitting   Review of Systems  Musculoskeletal: Positive for joint pain and neck pain.  Neurological: Positive for tingling, sensory change and focal weakness.     Past Medical History:  Diagnosis Date  . Hypertension     Past Surgical History:  Procedure Laterality Date  . APPENDECTOMY    . BACK SURGERY    . KNEE SURGERY    . ROTATOR CUFF REPAIR    . thumb surgery      Family History  Problem Relation Age of Onset  . Heart disease Mother   . High blood pressure Mother   . Cancer Father   . High blood pressure Father    Social History   Tobacco Use  . Smoking status: Never Smoker  . Smokeless tobacco: Never Used  Substance Use Topics  . Alcohol use: No  . Drug use: No    Allergies  Allergen  Reactions  . Other     Mayo, Nausea and hives     Current Meds  Medication Sig  . HYDROcodone-acetaminophen (NORCO/VICODIN) 5-325 MG tablet Take 1 tablet by mouth every 4 (four) hours as needed.  Marland Kitchen ibuprofen (ADVIL,MOTRIN) 800 MG tablet Take 1 tablet (800 mg total) by mouth 3 (three) times daily.  . methocarbamol (ROBAXIN-750) 750 MG tablet Take 1 tablet (750 mg total) by mouth 4 (four) times daily.    BP (!) 158/98   Pulse 67   Ht 6' (1.829 m)   Wt 218 lb (98.9 kg)   BMI 29.57 kg/m   Physical Exam Vitals signs and nursing note reviewed.  Constitutional:      General: He is in acute distress.     Appearance: Normal appearance. He is normal weight.  Neurological:     Mental Status: He is alert and oriented to person, place, and time.     Gait: Gait normal.     Deep Tendon Reflexes:     Reflex Scores:      Tricep reflexes are 3+ on the right side and 3+ on the left side.      Bicep reflexes are 3+ on the right side  and 3+ on the left side.      Brachioradialis reflexes are 3+ on the right side and 2+ on the left side. Psychiatric:        Mood and Affect: Mood normal.        Behavior: Behavior normal.        Thought Content: Thought content normal.        Judgment: Judgment normal.     Ortho Exam  Cervical spine anterior cervical incision from his prior surgery.  He is holding his neck completely still and stiff with no reasonable rotation to the left painful rotation to the right poor flexion extension with increased pain.  The left trap is in intense spasm.  He is tender over the trap and the midline cervical spine starting at C5-T1.  The left shoulder is mildly tender and is painful to move in any direction. active range of motion abduction 15 flexion 20 external rotation 10 His left hand elbow and wrist were normal he gave me a good strong grip strength with normal range of motion in those joints none of those joints were unstable the shoulder looked reduced muscle tone  was normal without tremor in the left upper extremity there were no skin lesions on the cervical spine or shoulder  He had good distal pulses bilaterally he had no lymphatic enlargement on the left side in the supraclavicular pericervical region.  His right upper extremity showed normal range of motion of the shoulder elbow and wrist with no instability of either of the joints good grip strength skin was normal there is no palpable tenderness pulse was good temperature was normal there is no lymphadenopathy and sensation was normal  We compared sharp and dull sensory right to left.  Everything was normal with the following exceptions: On the left side he had decreased pinprick and soft touch in the small and ring finger    MEDICAL DECISION SECTION  Xrays were done at [facility]  My independent reading of xrays:  Left shoulder glenohumeral joint normal mild degenerative changes in the [facility] joint.  None of these findings look acute  Cervical spine C-spine plate O0-H2 looks to have a good fusion mass with no hardware complications there is some notable degenerative changes at C2-3  Encounter Diagnoses  Name Primary?  . Neck pain Yes  . Cervical spondylosis with radiculopathy     PLAN:  I recommend the patient perform no work including no light duty until with his radicular symptoms are defined and diagnosed as to the cause  I placed him in a cervical collar to try to alleviate the spasms in his cervical spine and shoulder  He should continue hydrocodone Motrin and Robaxin  MRI of the cervical spine needs to be done.  I am not a spine specialist and once the MRI is done I strongly recommend the patient be sent to a spine specialist.    No orders of the defined types were placed in this encounter.   Fuller Canada, MD  12/12/2018 11:38 AM

## 2018-12-12 NOTE — Telephone Encounter (Signed)
Dr Romeo Apple wants to get MRI of his cervical spine Can you help me with [facility] authorization?

## 2018-12-12 NOTE — Patient Instructions (Addendum)
W/C MRI ORDERED FU WITH REPORT   CERVICAL COLLAR   OOW 6 WEEKS

## 2018-12-19 ENCOUNTER — Other Ambulatory Visit: Payer: Self-pay | Admitting: Orthopedic Surgery

## 2018-12-19 NOTE — Telephone Encounter (Signed)
Patient called to ask if Dr Romeo Apple may refill medication:  HYDROcodone-acetaminophen (NORCO/VICODIN) 5-325 MG tablet / Pharmac: [facility] [place]  - patient said Dr Romeo Apple asked at time of visit on 12/12/18, as he was dispensed a small quantity by Emergency room provider, and at the time, patient declined. Presently, patient is having more pain, and Worker's comp review is still in process regarding MRI. Next appointment pending.

## 2018-12-22 ENCOUNTER — Other Ambulatory Visit: Payer: Self-pay | Admitting: Orthopedic Surgery

## 2018-12-22 MED ORDER — HYDROCODONE-ACETAMINOPHEN 5-325 MG PO TABS: 1.0000 | ORAL_TABLET | ORAL | 0 refills | Status: DC | PRN

## 2018-12-30 ENCOUNTER — Telehealth: Payer: Self-pay | Admitting: Orthopedic Surgery

## 2018-12-30 NOTE — Telephone Encounter (Signed)
Faxed to them thank you

## 2018-12-30 NOTE — Telephone Encounter (Signed)
Per Worker's comp 3rd party contact, [employer] (faxed note received 12/30/18) indicating  MRI approved with W/C claim # [id number], for [facility], [facility] location: appointment scheduled 01/20/19 at 3:30pm; patient aware. [employer] requested order to be faxed to  [facility] to Fax #[phone]

## 2019-01-14 ENCOUNTER — Other Ambulatory Visit: Payer: Self-pay | Admitting: Orthopedic Surgery

## 2019-01-14 MED ORDER — HYDROCODONE-ACETAMINOPHEN 5-325 MG PO TABS: 1.0000 | ORAL_TABLET | ORAL | 0 refills | Status: AC | PRN

## 2019-01-14 NOTE — Telephone Encounter (Signed)
Hydrocodone-Acetaminophen  5/325 mg  Qty 20 Tablets  Take 1 tablet by mouth every 4 (four) hours as needed.  PATIENT USES [place] [facility]

## 2019-01-28 ENCOUNTER — Encounter: Payer: Self-pay | Admitting: Orthopedic Surgery

## 2019-01-28 ENCOUNTER — Other Ambulatory Visit: Payer: Self-pay

## 2019-01-28 ENCOUNTER — Ambulatory Visit (INDEPENDENT_AMBULATORY_CARE_PROVIDER_SITE_OTHER): Payer: No Typology Code available for payment source | Admitting: Orthopedic Surgery

## 2019-01-28 ENCOUNTER — Telehealth: Payer: Self-pay | Admitting: Radiology

## 2019-01-28 DIAGNOSIS — M4722 Other spondylosis with radiculopathy, cervical region: Secondary | ICD-10-CM

## 2019-01-28 NOTE — Telephone Encounter (Signed)
Referral put in, disk will be placed at front desk for patient to pick up  Will you work on the [facility] approval for Neurosurgery? he has had previous neck surgery.

## 2019-01-28 NOTE — Telephone Encounter (Signed)
Marcelle Smiling called and left message for you saying that the adjustor for Mandrill Gotz is [employer], her ph [phone].  Claim # is [id number].

## 2019-01-28 NOTE — Telephone Encounter (Signed)
-----   Message from Vickki Hearing, MD sent at 01/28/2019  9:31 AM EDT ----- Referr to n surg  Mri disc is here

## 2019-01-28 NOTE — Progress Notes (Signed)
Virtual Visit via Telephone Note  I connected with Matthew [name] on 01/28/19 at  9:20 AM EDT by telephone and verified that I am speaking with the correct person using two identifiers.   I discussed the limitations, risks, security and privacy concerns of performing an evaluation and management service by telephone and the availability of in person appointments. I also discussed with the patient that there may be a patient responsible charge related to this service. The patient expressed understanding and agreed to proceed.    I discussed the assessment and treatment plan with the patient. The patient was provided an opportunity to ask questions and all were answered. The patient agreed with the plan and demonstrated an understanding of the instructions.   The patient was advised to call back or seek an in-person evaluation if the symptoms worsen or if the condition fails to improve as anticipated.  I provided 5 minutes of non-face-to-face time during this encounter.  Chief complaint neck pain  History this is a 48 year old male status post C3-C7 fusion many years ago after an injury at [facility] presents with neck pain and left shoulder pain which started March 2 when a box of heavy paper towels fell on his left shoulder.  His last day worked was March 3  He says his left upper extremity is very weak, he still having pain Review of Systems  Musculoskeletal: Positive for neck pain.  Neurological: Positive for tingling and focal weakness.    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  It is on a disc from an outside facility and will have to be picked up for his referral to neurosurgery   Current Outpatient Medications:  .  aspirin EC 81 MG tablet, Take 81 mg by mouth daily., Disp: , Rfl:  .  HYDROcodone-acetaminophen (NORCO/VICODIN) 5-325 MG tablet, Take 1 tablet by mouth every 4 (four) hours as needed., Disp:  20 tablet, Rfl: 0 .  ibuprofen (ADVIL,MOTRIN) 800 MG tablet, Take 1 tablet (800 mg total) by mouth 3 (three) times daily., Disp: 21 tablet, Rfl: 0 .  methocarbamol (ROBAXIN) 750 MG tablet, TAKE 1 TABLET (750 MG TOTAL) BY MOUTH 4 (FOUR) TIMES DAILY., Disp: 28 tablet, Rfl: 0 .  metoprolol succinate (TOPROL-XL) 25 MG 24 hr tablet, Take 25 mg by mouth daily. Take 1 tablet po daily., Disp: , Rfl:    Encounter Diagnosis  Name Primary?  . Cervical spondylosis with radiculopathy Yes   Medical decision making Data interpreted image  Established problem worse  He should be out of work starting April 3 through May 21  Referral to neurosurgery Fuller Canada, MD

## 2019-01-29 ENCOUNTER — Telehealth: Payer: Self-pay | Admitting: Radiology

## 2019-01-29 NOTE — Telephone Encounter (Signed)
Thank you :)

## 2019-01-29 NOTE — Telephone Encounter (Signed)
Ok, thanks Do I need to schedule or will they ??? Or do we not know yet.

## 2019-01-29 NOTE — Telephone Encounter (Signed)
Is work comp going to schedule the appointment with Neurosurgery, or do I need to do this?

## 2019-01-29 NOTE — Telephone Encounter (Signed)
We do not schedule it; worker's comp will schedule once they locate a provider in network.

## 2019-01-29 NOTE — Telephone Encounter (Signed)
Call returned to [facility], (new)adjustor, regarding patient's Workers comp claim# [id number], ph# [phone] / fax [phone] (email: [email or web address]) - re-sent notes and referral to her attention at [facility]

## 2019-01-29 NOTE — Telephone Encounter (Signed)
Yes. Notes have been entered into patient's referral. Worker's comp will locate provider in network and schedule appointment. They are aware of previous surgery. Patient aware of status.

## 2019-01-29 NOTE — Telephone Encounter (Signed)
Regarding referral for patient (Worker's comp) - notes have been entered into referral 01/28/19 - 01/29/19. Call returned to [facility], (new)adjustor, regarding patient's Workers comp claim# [id number], ph# [phone] / fax [phone] (email: [email or web address]) - re-sent notes and referral to her attention at [facility], for her to find a provider in their network and schedule appointment; patient aware of status.

## 2019-02-02 NOTE — Telephone Encounter (Signed)
Patient's adjuster Caitlyn Radford [phone], fax#[phone], email: [email or web address] responded with a message emailed, which I have forwarded on a secure email with the attachment, indicating that further information is needed regarding patient's previous history, before they can make the referral appointment.

## 2019-02-10 NOTE — Telephone Encounter (Signed)
Patient called and asked for you to call him.

## 2019-02-10 NOTE — Telephone Encounter (Signed)
I have called him, we are waiting for [facility] to schedule him with the Neurosurgeon. Patient is asking about a letter that was sent over on 02/02/19, the [facility] adjustor is waiting for a reply to a letter, Okey Regal, do you know what the letter is and if it has been responded to ?   Can you call him to let him know if we received a letter? And if we have responded to it?

## 2019-02-11 ENCOUNTER — Telehealth: Payer: Self-pay | Admitting: Radiology

## 2019-02-11 NOTE — Telephone Encounter (Signed)
I did not get in the box, nor did I get email, can you look. What does she want?

## 2019-02-11 NOTE — Telephone Encounter (Signed)
As discussed, email located. Questionnaire to be reviewed and completed by Dr Romeo Apple.  Copy of MRI report faxed to adjuster-states it was not attached to notes received.

## 2019-02-11 NOTE — Telephone Encounter (Signed)
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

## 2019-02-11 NOTE — Telephone Encounter (Signed)
Yes, I had placed the request in nurse box, and also forwarded the email to you. The referral is pending further information needed from previous surgery. I had entered a note, including an updated note in the referral. The (new) adjuster, Caitlyn Everlena Cooper has been working remotely due to covid-19, and best way to reach is via email: [email or web address]. If unable to do through email, fax# [phone]. Her ph# [phone]

## 2019-02-17 NOTE — Telephone Encounter (Signed)
I have placed the form in your box. I can not fill this out.

## 2019-02-27 NOTE — Telephone Encounter (Signed)
Patient called to relay appointment information which was set up through worker's comp - states on way to 10:00am appiontment today, 02/27/28, to [facility], [address], [place]. I verified with office, ph# [phone]; provider Dr Loreta Ave, orthopaedic surgeon (not neurosurgeon.)

## 2019-02-27 NOTE — Telephone Encounter (Signed)
WC made appt, if they prefer ortho, that is their choice. Thanks

## 2019-03-17 ENCOUNTER — Telehealth: Payer: Self-pay | Admitting: Orthopedic Surgery

## 2019-03-17 NOTE — Telephone Encounter (Signed)
Routing: Dr Aline Brochure - please review telephone note and please advise, or speak with Arbie Cookey please.

## 2019-03-17 NOTE — Telephone Encounter (Signed)
Patient came by office this morning, 03/17/19 and, dropped off to front office, an envelope with form inside. I personally was not in office at the time, and spoke with patient to discuss. Patient relays he received in mail from employer, [employer] [place] [employer] [place] [employer], an FMLA form. Patient said it was recommended that it be filled out to protect his job, as the [employer] comp claim is currently denied, pending further information. Patient said he "finally" reached Worker's comp [employer], [phone], fax [phone], email: [email or web address], and was told that some questions, or paperwork, "had not been received from Dr Aline Brochure in a timely fashion." He is aware that covid-19 restrictions were under way when this all started, and that this is the reason he had a hard time reaching Workers comp.    Patient also relayed that adjuster had told him that he actually should not have attended the referral appointment - which Worker's comp had approved and directly scheduled - with Dr Earleen Newport in [place], due to his claim not currently open. States he has a follow up appointment with Dr Earleen Newport for 03/31/19. States he was advised that if the information requested to Dr Aline Brochure is received, his claim should be able to be re-opened.  In meantime, his job is asking that he have the Fmla form completed. I reviewed the forms process for our clinic per [employer] protocol. Please advise regarding status of the questionnaire.

## 2019-03-17 NOTE — Telephone Encounter (Signed)
I sent the questionnaire to Dr Aline Brochure, and placed it in his box, you will have to ask him what he did with it  He did not respond to my message in epic regarding the questionnaire, I sent him 2 messages. You may also ask Abigail Butts to help you.  I can not help with this, sorry, Dr Aline Brochure has not responded

## 2019-03-18 NOTE — Telephone Encounter (Signed)
Called back to [employer], left message after speaking with Dr Aline Brochure, as survey needs to be re-sent. Left message; also attempting fax and email per contact information noted.

## 2019-03-18 NOTE — Telephone Encounter (Signed)
What ????  dont put messages like this in a chart !!!!  Talk to me directly

## 2019-03-25 NOTE — Telephone Encounter (Signed)
Due to no response from adjuster [employer], emailed request for questionnaire to be re-sent, per Dr Aline Brochure. Spoke with patient; aware of status.

## 2019-03-26 NOTE — Telephone Encounter (Signed)
Copy of questionnaire has been received and placed in Dr Ruthe Mannan box.

## 2019-03-30 NOTE — Telephone Encounter (Signed)
Questionnaire has been completed by Dr Aline Brochure and emailed to Workers comp [employer], as per request. Patient aware.

## 2019-04-02 NOTE — Telephone Encounter (Signed)
Also have faxed patient's form for [employer] to employer as per request, per completion by our forms provider, [employer], and Dr Aline Brochure. Patient aware.

## 2020-04-21 IMAGING — CR DG CERVICAL SPINE COMPLETE 4+V
6 series · 6 of 6 positions shown · non-contrast
Comparison: CT 08/25/2016

CLINICAL DATA: Fall with injury to neck with left shoulder and neck
pain.

EXAM:
CERVICAL SPINE - COMPLETE 4+ VIEW

[w cervical spine lat]
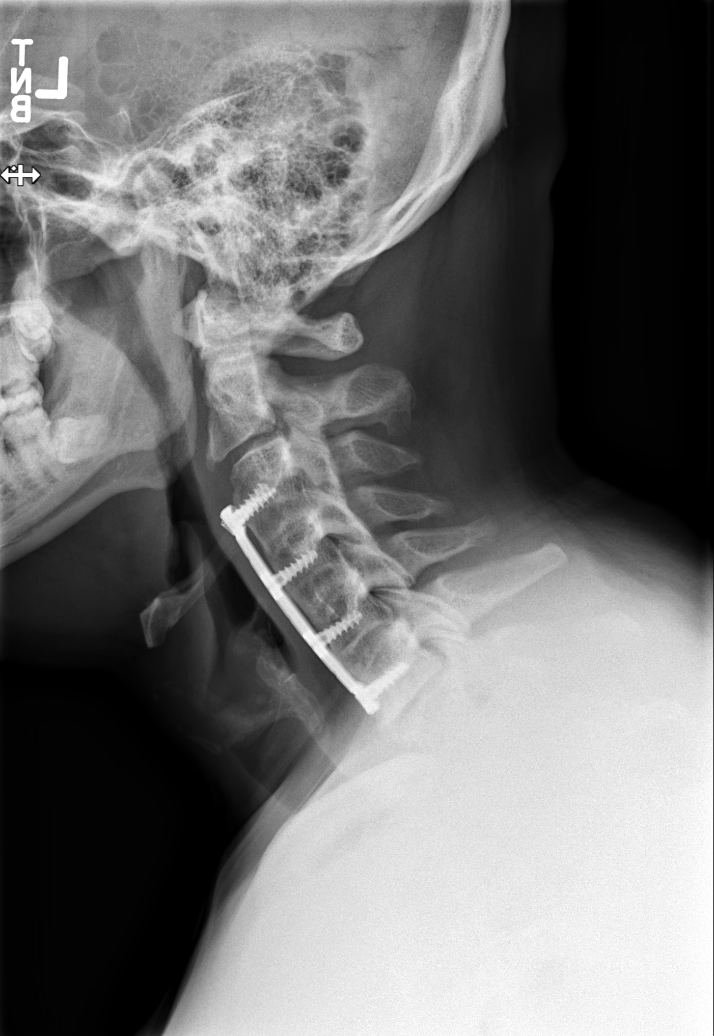

[w cervical spine ap_obl (1 of 2)]
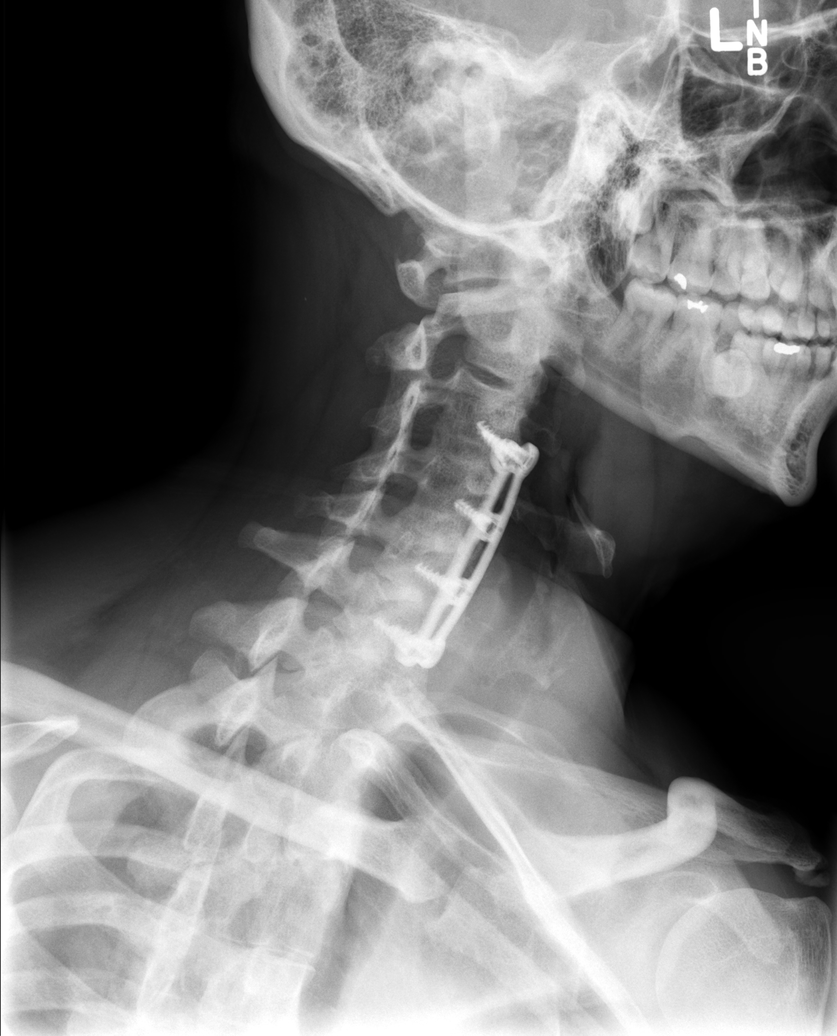

[w cervical spine ap_obl (2 of 2)]
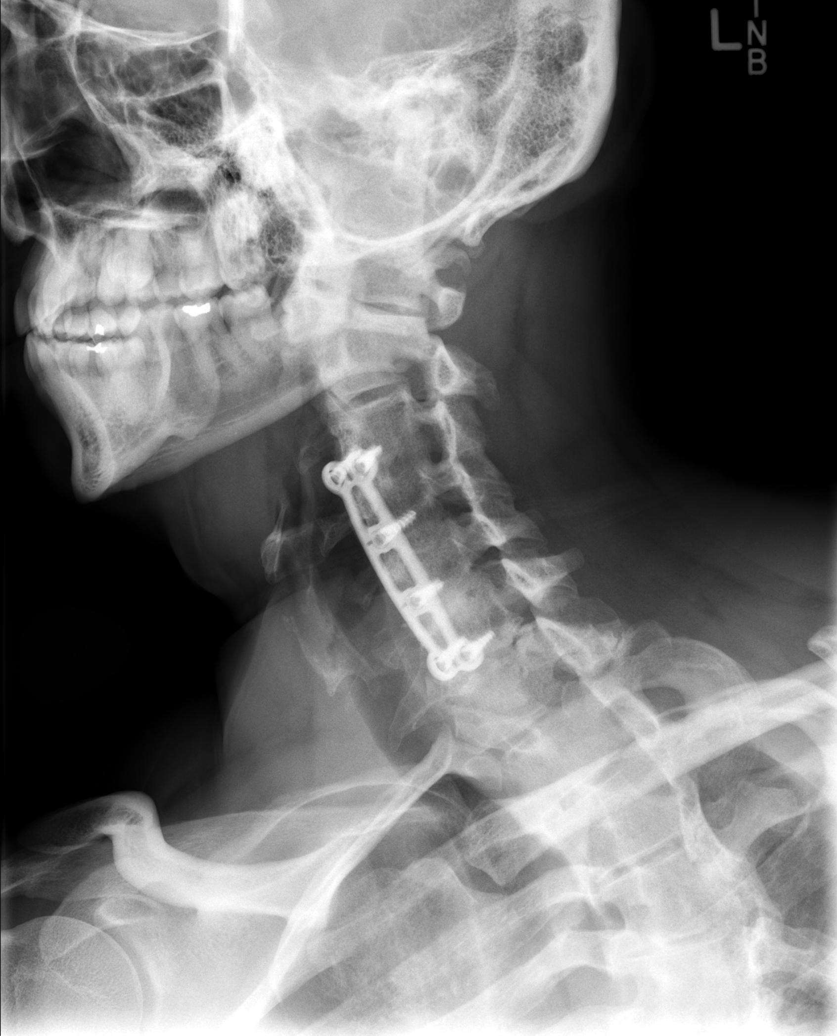

[w cervical spine ap]
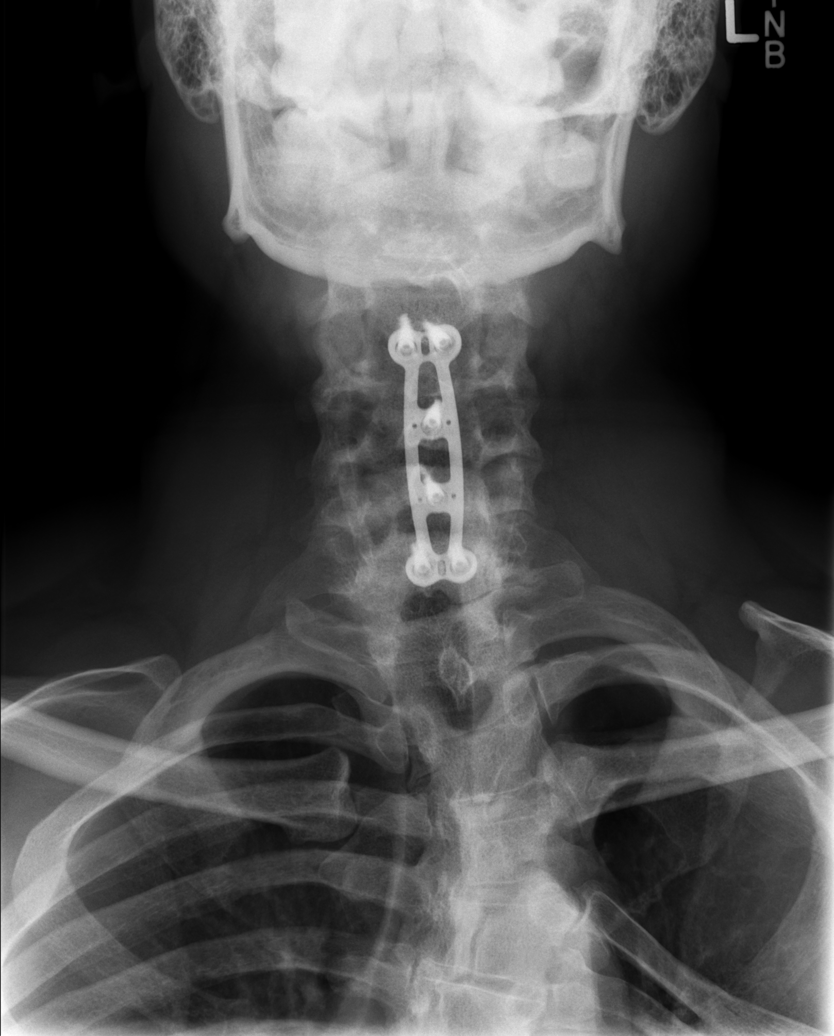

[w cervical spine odontoid]
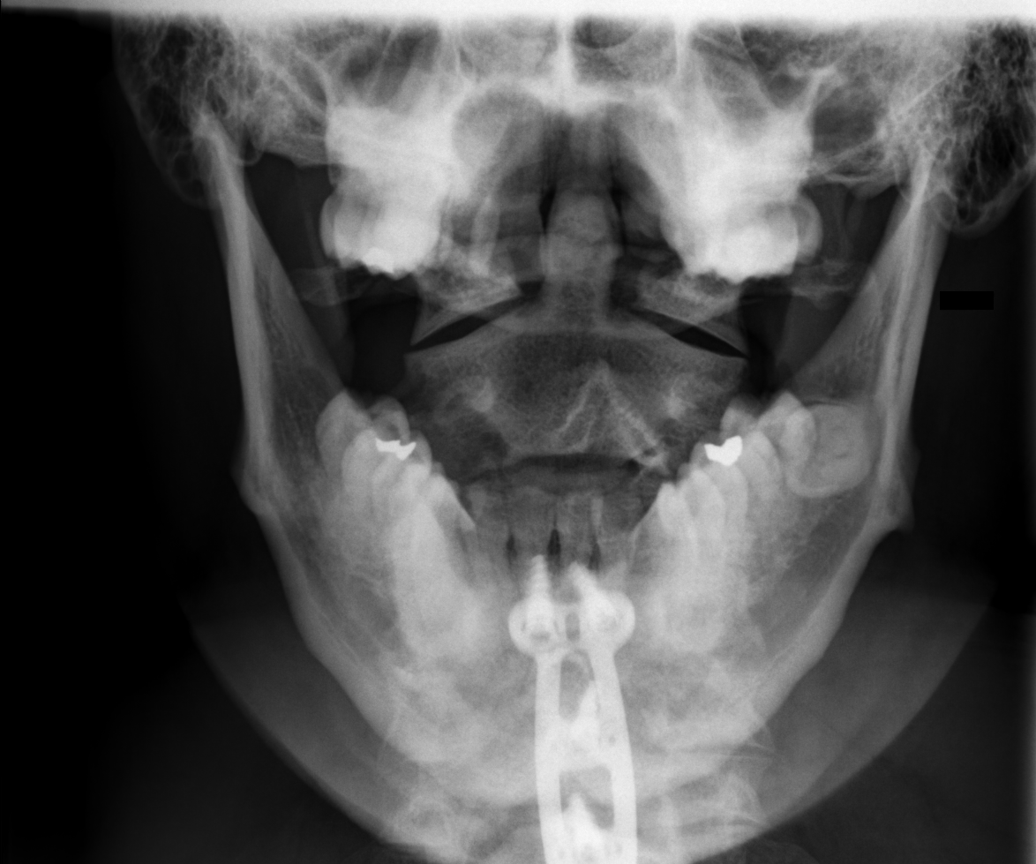

[w cervical swimmers]
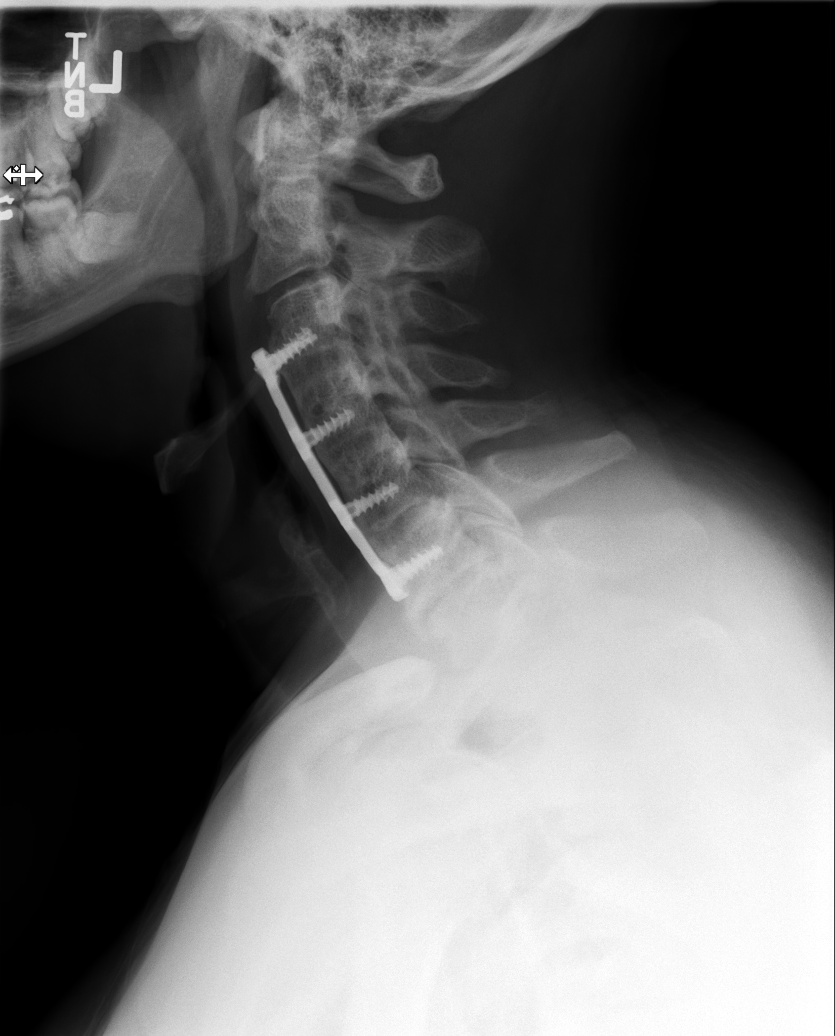

[6 of 6 positions shown; findings below may reference images not displayed]

FINDINGS: Vertebral body alignment and heights are normal. Mild spondylosis of
the cervical spine. Anterior fusion hardware is present and intact
from C3-C6. Interbody fusion from C3-C6. Mild left-sided neural
foraminal narrowing at the C3-4 level and C6-7 level. Mild
uncovertebral joint spurring and facet arthropathy is present.
Atlantoaxial articulation is normal. Prevertebral soft tissues are
unremarkable
IMPRESSION: No acute findings.

Mild spondylosis of the cervical spine. Anterior fusion hardware
intact unchanged from C3-C6 with interbody fusion from C3-C6. Mild
left-sided neural foraminal narrowing at the C3-4 and C6-7 levels.

## 2020-04-21 IMAGING — CR DG SHOULDER 2+V*L*
3 series · 3 of 3 positions shown · non-contrast
Comparison: 12/16/2012 and chest x-ray 08/25/2016

CLINICAL DATA: Fall with left shoulder pain.

EXAM:
LEFT SHOULDER - 2+ VIEW

[w shoulder internal left]
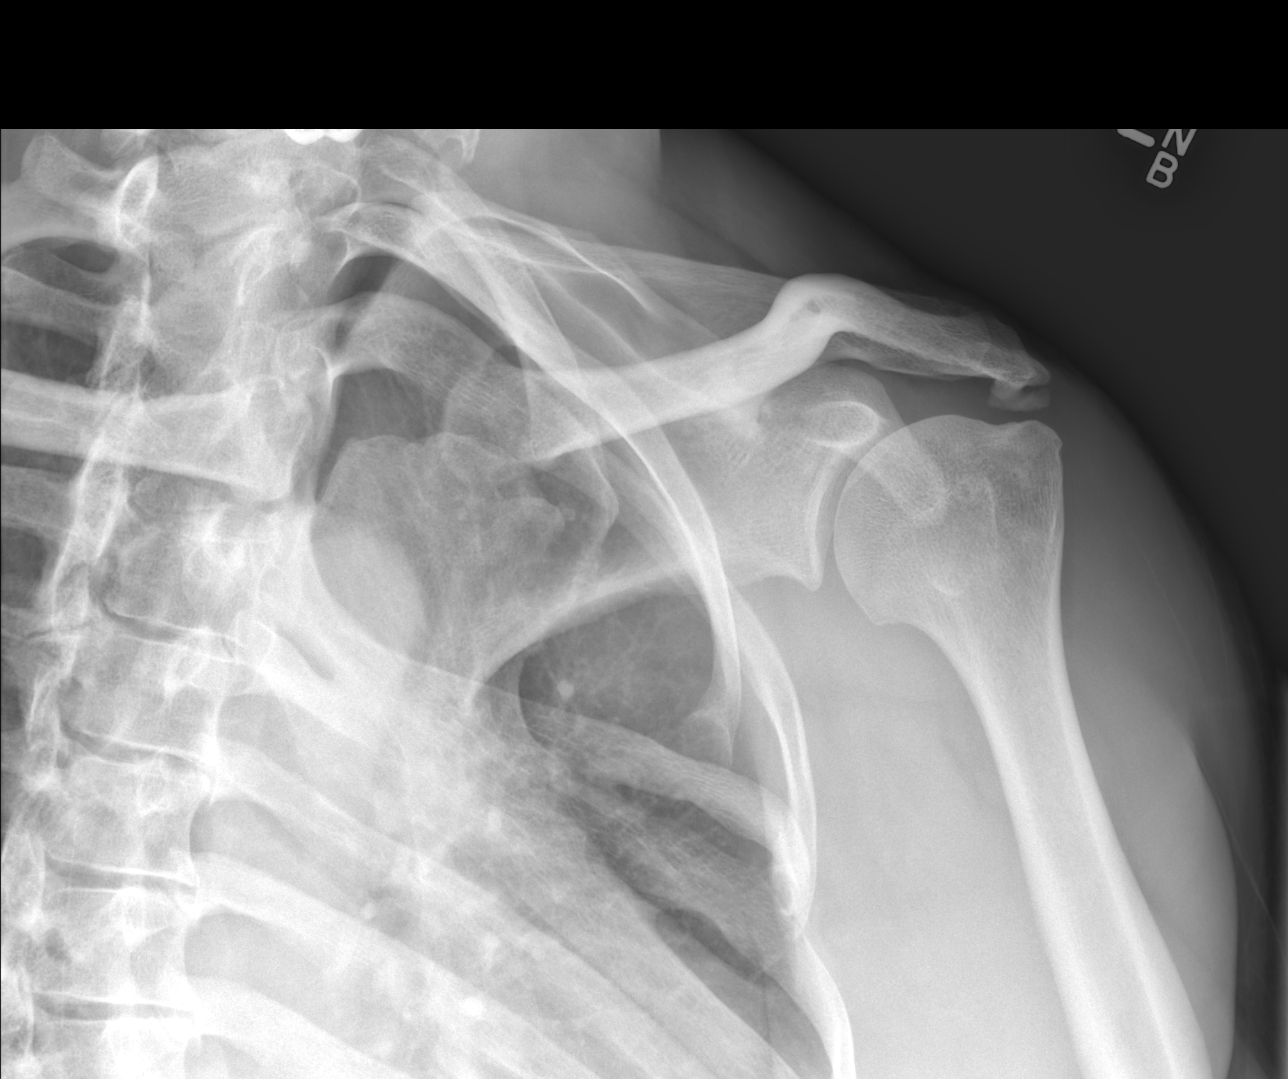

[w shoulder external left]
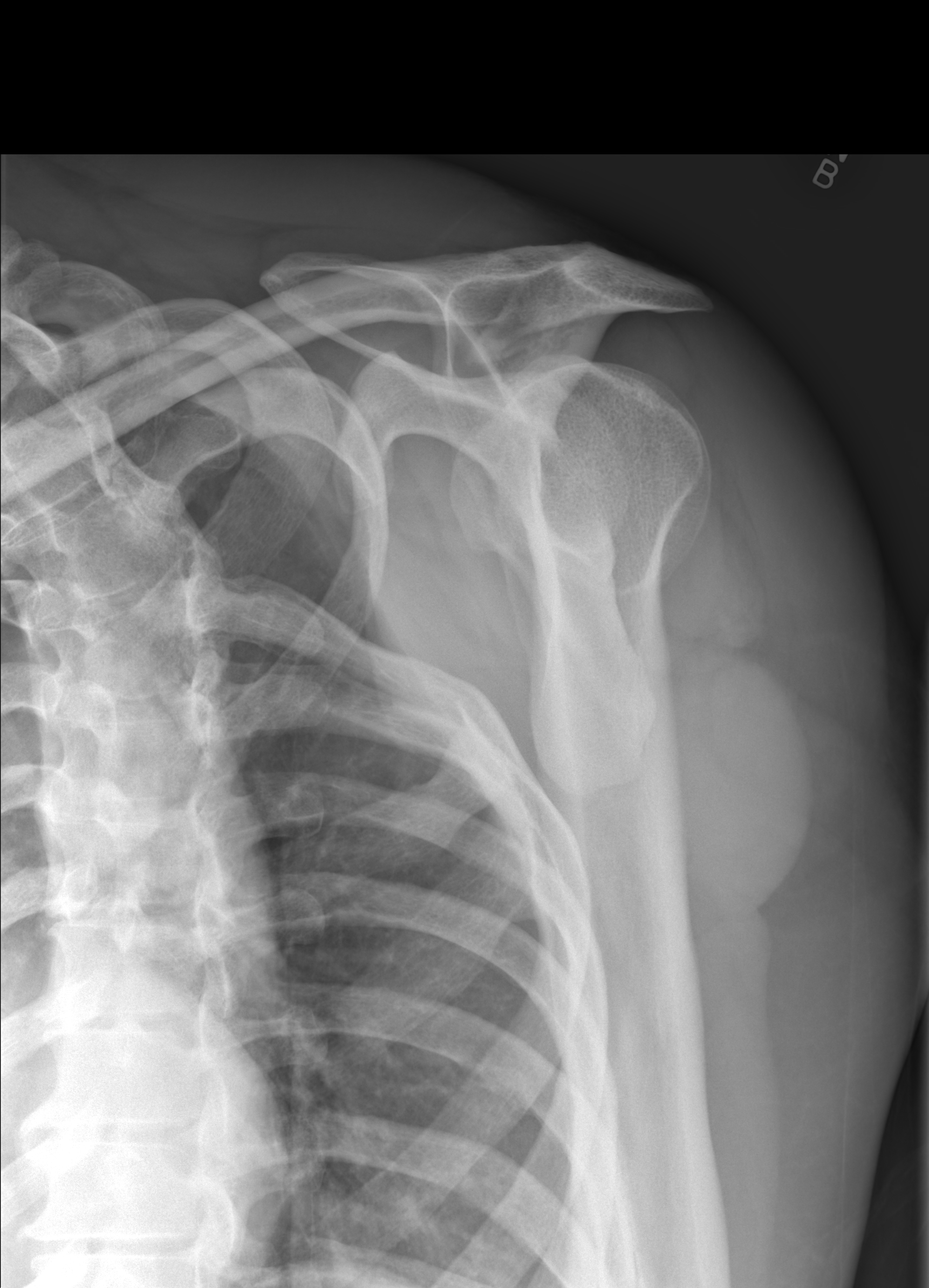

[x shoulder axillary left]
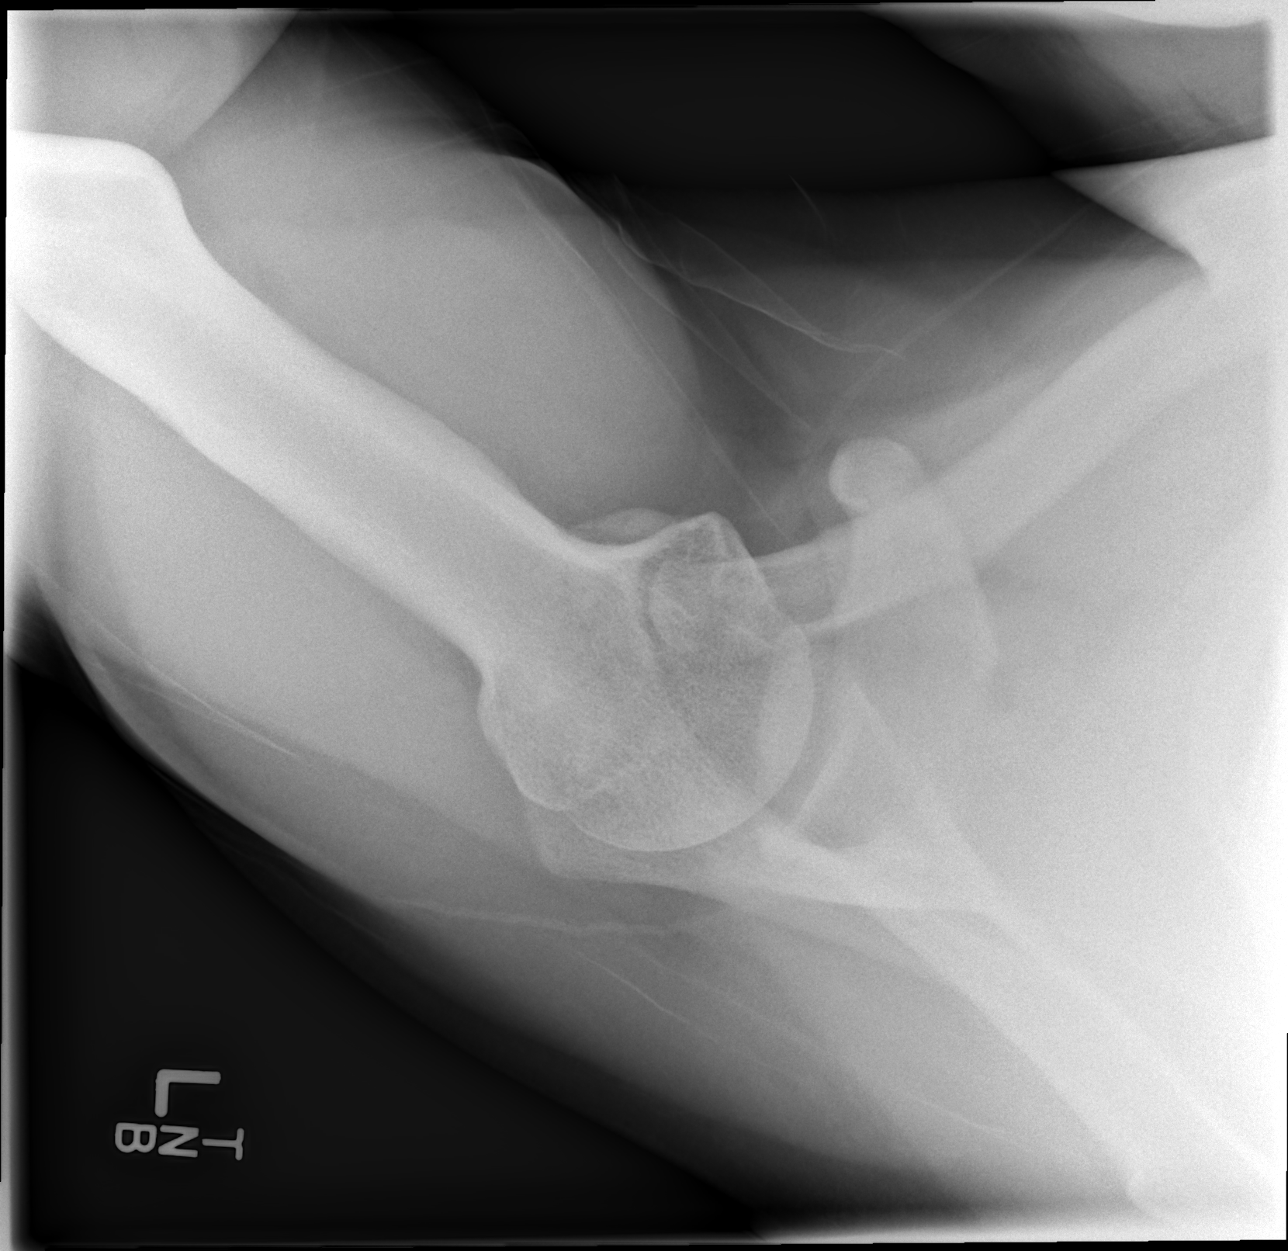

[3 of 3 positions shown; findings below may reference images not displayed]

FINDINGS: Subtle early degenerative change over the glenohumeral joint.
Minimal degenerative change over the AC joint. Prominent downgoing
spur over the distal acromion unchanged. Stable deformity of the
upper left bony thorax.
IMPRESSION: No acute findings.

Mild degenerate changes of the left shoulder. Prominent downgoing
spur over the a chromium unchanged.

## 2021-09-05 ENCOUNTER — Other Ambulatory Visit: Payer: Self-pay | Admitting: Nurse Practitioner

## 2021-09-05 ENCOUNTER — Other Ambulatory Visit (HOSPITAL_COMMUNITY): Payer: Self-pay | Admitting: Nurse Practitioner

## 2021-09-05 DIAGNOSIS — G44309 Post-traumatic headache, unspecified, not intractable: Secondary | ICD-10-CM

## 2021-09-07 ENCOUNTER — Other Ambulatory Visit: Payer: Self-pay | Admitting: Nurse Practitioner

## 2021-09-07 ENCOUNTER — Other Ambulatory Visit (HOSPITAL_COMMUNITY): Payer: Self-pay | Admitting: Nurse Practitioner

## 2021-09-08 ENCOUNTER — Other Ambulatory Visit: Payer: Self-pay | Admitting: Nurse Practitioner

## 2021-09-08 ENCOUNTER — Other Ambulatory Visit (HOSPITAL_COMMUNITY): Payer: Self-pay | Admitting: Nurse Practitioner

## 2021-09-08 DIAGNOSIS — M5441 Lumbago with sciatica, right side: Secondary | ICD-10-CM

## 2021-09-08 DIAGNOSIS — M25551 Pain in right hip: Secondary | ICD-10-CM

## 2021-09-19 ENCOUNTER — Ambulatory Visit (HOSPITAL_COMMUNITY): Payer: BC Managed Care – PPO

## 2021-09-19 ENCOUNTER — Encounter (HOSPITAL_COMMUNITY): Payer: Self-pay

## 2021-09-22 ENCOUNTER — Ambulatory Visit (HOSPITAL_COMMUNITY): Payer: Self-pay

## 2021-09-22 ENCOUNTER — Other Ambulatory Visit: Payer: Self-pay

## 2021-09-22 ENCOUNTER — Ambulatory Visit (HOSPITAL_COMMUNITY)
Admission: RE | Admit: 2021-09-22 | Discharge: 2021-09-22 | Disposition: A | Discharge status: Home / Self Care | Payer: Self-pay | Source: Ambulatory Visit | Attending: Nurse Practitioner | Admitting: Nurse Practitioner

## 2021-09-22 ENCOUNTER — Other Ambulatory Visit (HOSPITAL_COMMUNITY): Payer: Self-pay | Admitting: Nurse Practitioner

## 2021-09-22 DIAGNOSIS — M5441 Lumbago with sciatica, right side: Secondary | ICD-10-CM | POA: Insufficient documentation

## 2021-09-22 DIAGNOSIS — G44309 Post-traumatic headache, unspecified, not intractable: Secondary | ICD-10-CM | POA: Insufficient documentation

## 2021-09-22 DIAGNOSIS — M25552 Pain in left hip: Secondary | ICD-10-CM | POA: Insufficient documentation

## 2021-09-22 DIAGNOSIS — M25551 Pain in right hip: Secondary | ICD-10-CM | POA: Insufficient documentation

## 2021-09-26 ENCOUNTER — Other Ambulatory Visit (HOSPITAL_COMMUNITY): Payer: Self-pay | Admitting: Nurse Practitioner

## 2021-09-26 ENCOUNTER — Other Ambulatory Visit: Payer: Self-pay | Admitting: Nurse Practitioner

## 2021-09-26 DIAGNOSIS — M5441 Lumbago with sciatica, right side: Secondary | ICD-10-CM

## 2021-10-06 ENCOUNTER — Ambulatory Visit (HOSPITAL_COMMUNITY)
Admission: RE | Admit: 2021-10-06 | Discharge: 2021-10-06 | Disposition: A | Discharge status: Home / Self Care | Payer: Self-pay | Source: Ambulatory Visit | Attending: Nurse Practitioner | Admitting: Nurse Practitioner

## 2021-10-06 ENCOUNTER — Other Ambulatory Visit: Payer: Self-pay

## 2021-10-06 DIAGNOSIS — M5441 Lumbago with sciatica, right side: Secondary | ICD-10-CM | POA: Insufficient documentation

## 2021-10-12 ENCOUNTER — Other Ambulatory Visit: Payer: Self-pay

## 2021-10-12 DIAGNOSIS — G4730 Sleep apnea, unspecified: Secondary | ICD-10-CM

## 2021-11-27 ENCOUNTER — Other Ambulatory Visit: Payer: Self-pay

## 2021-11-27 ENCOUNTER — Ambulatory Visit (HOSPITAL_COMMUNITY): Payer: Self-pay | Attending: Nurse Practitioner | Admitting: Physical Therapy

## 2021-11-27 DIAGNOSIS — M5441 Lumbago with sciatica, right side: Secondary | ICD-10-CM | POA: Insufficient documentation
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
## 2021-11-27 NOTE — Patient Instructions (Signed)
Access Code: [email or web address] URL: [email or web address] Date: 11/27/2021 Prepared by: AP - Rehab  Exercises Supine Transversus Abdominis Bracing - Hands on Stomach - 2-3 x daily - 7 x weekly - 1 sets - 10 reps - 3 hold Supine Lower Trunk Rotation - 2-3 x daily - 7 x weekly - 2 sets - 10 reps - 3 hold

## 2021-11-29 ENCOUNTER — Ambulatory Visit (HOSPITAL_COMMUNITY): Payer: Self-pay | Admitting: Physical Therapy

## 2021-12-05 ENCOUNTER — Encounter (HOSPITAL_COMMUNITY): Payer: Self-pay

## 2021-12-05 ENCOUNTER — Other Ambulatory Visit: Payer: Self-pay

## 2021-12-05 ENCOUNTER — Ambulatory Visit (HOSPITAL_COMMUNITY): Payer: Self-pay | Attending: Nurse Practitioner

## 2021-12-05 DIAGNOSIS — M5441 Lumbago with sciatica, right side: Secondary | ICD-10-CM | POA: Insufficient documentation

## 2021-12-05 NOTE — Therapy (Signed)
[place], [place], [address] Phone: [phone]   Fax:  [phone]  Physical Therapy Treatment  Patient Details  Name: Matthew [name] MRN: [id number] Date of Birth: 1970-11-09 Referring Provider (PT): Perfecto Kingdom   Encounter Date: 12/05/2021   PT End of Session - 12/05/21 0923     Visit Number 2    Number of Visits 8    Date for PT Re-Evaluation 12/25/21    Authorization Type self pay    PT Start Time 0910    PT Stop Time 0950    PT Time Calculation (min) 40 min    Activity Tolerance Patient limited by pain;Patient tolerated treatment well    Behavior During Therapy [facility] for tasks assessed/performed             Past Medical History:  Diagnosis Date   Hypertension     Past Surgical History:  Procedure Laterality Date   APPENDECTOMY     BACK SURGERY     KNEE SURGERY     ROTATOR CUFF REPAIR     thumb surgery      There were no vitals filed for this visit.   Subjective Assessment - 12/05/21 0911     Subjective Reports that mornings are the hardest, can't put on shoes from back pain, and ready to work; its a bit hard to lay on back with some radiating symptoms down both sides today as it was originally down right leg and recently and today down left. Prefers laying on stomach or left side at home    Limitations Sitting;Standing;Lifting;Walking    How long can you sit comfortably? 15 min    How long can you stand comfortably? 10    How long can you walk comfortably? 10    Patient Stated Goals get the pain to stop    Currently in Pain? Yes    Pain Score 7     Pain Location Back    Pain Orientation Lower    Pain Descriptors / Indicators Dull;Stabbing    Pain Type Chronic pain    Pain Radiating Towards Cont with radiating down R leg as well today into L leg to toe    Pain Onset More than a month ago    Pain Frequency Constant                               [facility] Adult  PT Treatment/Exercise - 12/05/21 0924       Bed Mobility   Bed Mobility Rolling Left;Supine to Sit      Therapeutic Activites    Therapeutic Activities Other Therapeutic Activities   Prone positioning   Other Therapeutic Activities Prone positioning over 1 pillow x 5 mins, then prone on elbows x 1 min x 2 reps      Lumbar Exercises: Stretches   Prone on Elbows Stretch 2 reps;60 seconds      Lumbar Exercises: Supine   Ab Set 20 reps;5 seconds    AB Set Limitations over moist heat with cueing for breathing and proper abdominal activation    Other Supine Lumbar Exercises Lower trunk rotations over heat 2 x 10 with cue for gentle rocking control      Modalities   Modalities Moist Heat   15 minutes concurrent with supine movement     Moist Heat Therapy   Number Minutes Moist Heat 15 Minutes    Moist  Heat Location Lumbar Spine      Manual Therapy   Manual Therapy Manual Traction;Joint mobilization    Manual therapy comments trial of supine manual traction to assess for future use of traction machine; trial of prone lumbar joint mobs    Joint Mobilization Prone L/S L1-L5 grade II PPIVMs PAs x 5 reps    Manual Traction trial of supine hooklying B LE manual traction 4 x 30 seconds with patient reporting fair improvement; trial of longitudinal B LE and single leg traction with patient trial min improvement                     PT Education - 12/05/21 0922     Education Details Cont with discussion and education of movement and support of lumbar spine with positioning supine verse prone, cont with core education and activation; Discussion and education of personal TENS unit, directed to use with movement including prior HEP for pain relief as able    Person(s) Educated Patient    Methods Explanation;Demonstration;Tactile cues    Comprehension Verbalized understanding;Returned demonstration              PT Short Term Goals - 11/27/21 1534       PT SHORT TERM GOAL #1    Title Patient will be independent in HEP to improve functional outcomes.    Time 2    Period Weeks    Status New    Target Date 12/11/21               PT Long Term Goals - 11/27/21 1535       PT LONG TERM GOAL #1   Title Patient will report at least 50% improvement in overall symptoms and/or function to demonstrate improved functional mobility    Time 4    Period Weeks    Status New    Target Date 12/25/21      PT LONG TERM GOAL #2   Title Patient will be able to demonstrate increased lumbar motion in ext to 50% limited and flexion to 25% limited to improve patient's ability to don his shoes independently when getting dressing in the morning.    Time 4    Period Weeks    Status New    Target Date 12/25/21      PT LONG TERM GOAL #3   Title Patient will ambulate x 250 ft without AD with 2 MWT    Baseline 50    Time 4    Period Weeks    Status New    Target Date 12/25/21                   Plan - 12/05/21 0953     Clinical Impression Statement A: Patient attending first session after evaluation with focus on review of HEP with good form and needs for cueing to properly activate core.  Discussion and education on spinal anatomy, muscular support, and proper movement initiated today with supine verse prone positioning. Unclear if prone positioning helpful secondary to increased symptoms at end of session.  During hook lying supine trial of manual bilateral lower extremity traction, patient with some minimal results and thus trial of traction unit in future sessions recommended as well as continued core activation and movement integration as able. Patient is a good candidate to continue with skilled physical therapy to progress toward goals and return to prior level of function.    Personal Factors and Comorbidities Time since onset of  injury/illness/exacerbation    Examination-Activity Limitations Bathing;Stairs;Squat;Lift;Bed Mobility;Bend;Locomotion Level;Caring for  [employer];Reach Overhead;Sit;Sleep;Dressing;Stand;Transfers    Examination-Participation Restrictions Church;Cleaning;Community Activity;Driving;Yard Work    Stability/Clinical Decision Making Stable/Uncomplicated    Rehab Potential Good    PT Frequency 2x / week    PT Duration 4 weeks    PT Treatment/Interventions ADLs/Self Care Home Management;Aquatic Therapy;Biofeedback;Canalith Repostioning;Cryotherapy;Ultrasound;Traction;Moist Heat;Iontophoresis 4mg /ml Dexamethasone;Electrical Stimulation;Fluidtherapy;Contrast Bath;DME Instruction;Gait training;Stair training;Functional mobility training;Neuromuscular re-education;Balance training;Therapeutic exercise;Therapeutic activities;Patient/family education;Orthotic Fit/Training;Manual techniques;Compression bandaging;Passive range of motion;Dry needling;Scar mobilization;Energy conservation;Splinting;Taping;Spinal Manipulations;Visual/perceptual remediation/compensation;Vestibular;Vasopneumatic Device;Joint Manipulations    PT Next Visit Plan Review HEP, progress lumbar stabilization exercises; continue try prone lying if patient okay after today's visit, add traction machine as able, add postural stabilization as well    PT Home Exercise Plan abdominal bracing x 10, Lower trunk rotations x 10; suggested to patient to trial with home TENS    Consulted and Agree with Plan of Care Patient             Patient will benefit from skilled therapeutic intervention in order to improve the following deficits and impairments:  Difficulty walking, Decreased mobility, Decreased endurance, Abnormal gait, Decreased range of motion, Impaired flexibility, Decreased strength, Decreased activity tolerance  Visit Diagnosis: Low back pain with right-sided sciatica, unspecified back pain laterality, unspecified chronicity     Problem List There are no problems to display for this patient.    9:59 AM, 12/05/21  Margarette Asal. Carlis Abbott PT, DPT  Contract Physical  Therapist at  [facility] [place], [place], [address] Phone: [phone]   Fax:  [phone]  Name: Matthew [name] MRN: [id number] Date of Birth: [birth year]

## 2021-12-07 ENCOUNTER — Ambulatory Visit (HOSPITAL_COMMUNITY): Payer: Self-pay

## 2021-12-07 ENCOUNTER — Encounter (HOSPITAL_COMMUNITY): Payer: Self-pay

## 2021-12-07 ENCOUNTER — Other Ambulatory Visit: Payer: Self-pay

## 2021-12-07 DIAGNOSIS — M5441 Lumbago with sciatica, right side: Secondary | ICD-10-CM
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
## 2021-12-11 ENCOUNTER — Ambulatory Visit (HOSPITAL_COMMUNITY): Payer: Self-pay | Admitting: Physical Therapy
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
## 2021-12-14 ENCOUNTER — Other Ambulatory Visit: Payer: Self-pay

## 2021-12-14 ENCOUNTER — Ambulatory Visit (HOSPITAL_COMMUNITY): Payer: Self-pay

## 2021-12-14 DIAGNOSIS — M5441 Lumbago with sciatica, right side: Secondary | ICD-10-CM

## 2021-12-18 ENCOUNTER — Encounter (HOSPITAL_COMMUNITY): Payer: Self-pay | Admitting: Physical Therapy

## 2021-12-18 ENCOUNTER — Other Ambulatory Visit: Payer: Self-pay

## 2021-12-18 ENCOUNTER — Ambulatory Visit (HOSPITAL_COMMUNITY): Payer: Self-pay | Admitting: Physical Therapy

## 2021-12-18 DIAGNOSIS — M5441 Lumbago with sciatica, right side: Secondary | ICD-10-CM
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
## 2021-12-20 ENCOUNTER — Ambulatory Visit (HOSPITAL_COMMUNITY): Payer: Self-pay | Admitting: Physical Therapy

## 2021-12-26 ENCOUNTER — Ambulatory Visit (HOSPITAL_COMMUNITY): Payer: Self-pay | Admitting: Physical Therapy

## 2021-12-26 ENCOUNTER — Telehealth (HOSPITAL_COMMUNITY): Payer: Self-pay | Admitting: Physical Therapy

## 2021-12-28 ENCOUNTER — Encounter (HOSPITAL_COMMUNITY): Payer: Medicaid Other

## 2021-12-28 ENCOUNTER — Telehealth (HOSPITAL_COMMUNITY): Payer: Self-pay

## 2021-12-28 NOTE — Telephone Encounter (Signed)
No show, called and left message concerning missed apt.  Pt with no further apts, included contact number and requested call back. ? ?Becky Sax, LPTA/CLT; CBIS ?[phone] ? ?

## 2022-01-30 NOTE — Telephone Encounter (Signed)
Vertell Limber, Care Coordinator assisting Nurse Case Manager, phoned to request all office visit notes for patient. Michelle faxed over signed ROI form from patient. Signed ROI form scanned into chart.Records were printed and faxed to [facility] ?

## 2022-03-07 ENCOUNTER — Ambulatory Visit: Payer: BLUE CROSS/BLUE SHIELD | Attending: Nurse Practitioner | Admitting: Neurology

## 2022-03-07 DIAGNOSIS — G4730 Sleep apnea, unspecified: Secondary | ICD-10-CM

## 2022-03-07 DIAGNOSIS — G4733 Obstructive sleep apnea (adult) (pediatric): Secondary | ICD-10-CM | POA: Diagnosis present

## 2022-03-07 DIAGNOSIS — G4761 Periodic limb movement disorder: Secondary | ICD-10-CM | POA: Insufficient documentation

## 2022-03-19 NOTE — Procedures (Signed)
  [facility] Alexiah Koroma A. Gerilyn Pilgrim, MD     [email or web address]             NOCTURNAL POLYSOMNOGRAPHY   LOCATION: [facility]  Patient Name: Matthew [name], Matthew [name] Date: 03/07/2022 Gender: Male D.O.B: 12-Mar-1971 Age (years): 17 Referring Provider: Cristino Martes NP Height (inches): 70 Interpreting Physician: Beryle Beams MD, ABSM Weight (lbs): 225 RPSGT: Alfonso Ellis BMI: 32 MRN: [id number] Neck Size: 17.00 CLINICAL INFORMATION Sleep Study Type: NPSG     Indication for sleep study: N/A     Epworth Sleepiness Score: 15     SLEEP STUDY TECHNIQUE As per the AASM Manual for the Scoring of Sleep and Associated Events v2.3 (April 2016) with a hypopnea requiring 4% desaturations.  The channels recorded and monitored were frontal, central and occipital EEG, electrooculogram (EOG), submentalis EMG (chin), nasal and oral airflow, thoracic and abdominal wall motion, anterior tibialis EMG, snore microphone, electrocardiogram, and pulse oximetry.  MEDICATIONS Medications self-administered by patient taken the night of the study : N/A  Current Outpatient Medications:    aspirin EC 81 MG tablet, Take 81 mg by mouth daily., Disp: , Rfl:    HYDROcodone-acetaminophen (NORCO/VICODIN) 5-325 MG tablet, Take 1 tablet by mouth every 4 (four) hours as needed., Disp: 20 tablet, Rfl: 0   ibuprofen (ADVIL,MOTRIN) 800 MG tablet, Take 1 tablet (800 mg total) by mouth 3 (three) times daily., Disp: 21 tablet, Rfl: 0   methocarbamol (ROBAXIN) 750 MG tablet, TAKE 1 TABLET (750 MG TOTAL) BY MOUTH 4 (FOUR) TIMES DAILY., Disp: 28 tablet, Rfl: 0   metoprolol succinate (TOPROL-XL) 25 MG 24 hr tablet, Take 25 mg by mouth daily. Take 1 tablet po daily., Disp: , Rfl:       SLEEP ARCHITECTURE The study was initiated at 9:39:48 PM and ended at 4:05:19 AM.  Sleep onset time was 10.7 minutes and the sleep efficiency was 89.2%. The total sleep time was 343.8 minutes.  Stage REM latency was 69.0  minutes.  The patient spent 11.78% of the night in stage N1 sleep, 64.37% in stage N2 sleep, 17.60% in stage N3 and 6.3% in REM.  Alpha intrusion was absent.  Supine sleep was 15.01%.  RESPIRATORY PARAMETERS The overall apnea/hypopnea index (AHI) was 38.7 per hour. There were 4 total apneas, including 4 obstructive, 0 central and 0 mixed apneas. There were 218 hypopneas and 0 RERAs.  The AHI during Stage REM sleep was 14.0 per hour.  AHI while supine was 96.5 per hour.  The mean oxygen saturation was 92.29%. The minimum SpO2 during sleep was 79.00%.  loud snoring was noted during this study.  CARDIAC DATA The 2 lead EKG demonstrated sinus rhythm. The mean heart rate was 62.06 beats per minute. Other EKG findings include: None.  LEG MOVEMENT DATA The total PLMS were 186 with a resulting PLMS index of 32.46.    IMPRESSIONS Moderate obstructive sleep apnea is documented this recording. Auto PAP 8-14 is recommended. Moderate periodic limb movements are also noted.   Argie Ramming, MD Diplomate, American Board of Sleep Medicine.  ELECTRONICALLY SIGNED ON:  03/19/2022, 9:46 PM [facility] PH: [phone]   FX: [phone] ACCREDITED BY THE AMERICAN ACADEMY OF SLEEP MEDICINE

## 2022-03-22 ENCOUNTER — Ambulatory Visit: Payer: BLUE CROSS/BLUE SHIELD | Admitting: Orthopaedic Surgery

## 2022-03-22 DIAGNOSIS — M76892 Other specified enthesopathies of left lower limb, excluding foot: Secondary | ICD-10-CM | POA: Diagnosis not present

## 2022-03-22 DIAGNOSIS — M545 Low back pain, unspecified: Secondary | ICD-10-CM | POA: Diagnosis not present

## 2022-03-22 NOTE — Progress Notes (Unsigned)
   Office Visit Note   Patient: Matthew [name]           Date of Birth: 1971/07/26           MRN: [id number] Visit Date: 03/22/2022              Requested by: Benita Stabile, MD [address] [place],  [place] [address] PCP: Benita Stabile, MD   Assessment & Plan: Visit Diagnoses: No diagnosis found.  Plan: ***  Follow-Up Instructions: No follow-ups on file.   Orders:  No orders of the defined types were placed in this encounter.  No orders of the defined types were placed in this encounter.     Procedures: No procedures performed   Clinical Data: No additional findings.   Subjective: Chief Complaint  Patient presents with   Lower Back - Pain    HPI   Review of Systems   Objective: Vital Signs: There were no vitals taken for this visit.  Physical Exam  Ortho Exam  Specialty Comments:  No specialty comments available.  Imaging: Lumbar MRI scan 10/06/2021 demonstrates no acute osseous injury lumbar spine.  Mild lumbar spine spondylosis as described in the report.  At L2-3 there is small left paracentral disc protrusion.  No acute changes in the lumbar spine.   PMFS History: There are no problems to display for this patient.  Past Medical History:  Diagnosis Date   Hypertension     Family History  Problem Relation Age of Onset   Heart disease Mother    High blood pressure Mother    Cancer Father    High blood pressure Father     Past Surgical History:  Procedure Laterality Date   APPENDECTOMY     BACK SURGERY     KNEE SURGERY     ROTATOR CUFF REPAIR     thumb surgery     Social History   Occupational History   Not on file  Tobacco Use   Smoking status: Never   Smokeless tobacco: Never  Substance and Sexual Activity   Alcohol use: No   Drug use: No   Sexual activity: Yes    Birth control/protection: None

## 2022-03-23 DIAGNOSIS — M76892 Other specified enthesopathies of left lower limb, excluding foot: Secondary | ICD-10-CM | POA: Insufficient documentation

## 2022-03-23 DIAGNOSIS — M545 Low back pain, unspecified: Secondary | ICD-10-CM | POA: Insufficient documentation

## 2023-11-06 ENCOUNTER — Encounter (INDEPENDENT_AMBULATORY_CARE_PROVIDER_SITE_OTHER): Payer: Self-pay | Admitting: *Deleted

## 2024-01-31 ENCOUNTER — Encounter: Payer: Self-pay | Admitting: Radiology

## 2024-05-05 ENCOUNTER — Encounter (INDEPENDENT_AMBULATORY_CARE_PROVIDER_SITE_OTHER): Payer: Self-pay | Admitting: *Deleted

## 2024-06-13 ENCOUNTER — Encounter (HOSPITAL_COMMUNITY): Payer: Self-pay | Admitting: Emergency Medicine

## 2024-06-13 ENCOUNTER — Emergency Department (HOSPITAL_COMMUNITY): Payer: Self-pay

## 2024-06-13 ENCOUNTER — Emergency Department (HOSPITAL_COMMUNITY)
Admission: EM | Admit: 2024-06-13 | Discharge: 2024-06-13 | Disposition: A | Discharge status: Home / Self Care | Payer: Self-pay | Attending: Emergency Medicine | Admitting: Emergency Medicine

## 2024-06-13 ENCOUNTER — Other Ambulatory Visit: Payer: Self-pay

## 2024-06-13 DIAGNOSIS — M25511 Pain in right shoulder: Secondary | ICD-10-CM | POA: Insufficient documentation

## 2024-06-13 DIAGNOSIS — W1789XA Other fall from one level to another, initial encounter: Secondary | ICD-10-CM | POA: Insufficient documentation

## 2024-06-13 DIAGNOSIS — W19XXXA Unspecified fall, initial encounter: Secondary | ICD-10-CM

## 2024-06-13 DIAGNOSIS — Z7982 Long term (current) use of aspirin: Secondary | ICD-10-CM | POA: Insufficient documentation

## 2024-06-13 MED ORDER — IBUPROFEN 800 MG PO TABS: 800.0000 mg | ORAL_TABLET | Freq: Four times a day (QID) | ORAL | 0 refills | Status: AC | PRN

## 2024-06-13 MED ORDER — METHOCARBAMOL 500 MG PO TABS: 500.0000 mg | ORAL_TABLET | Freq: Two times a day (BID) | ORAL | 0 refills | Status: AC | PRN

## 2024-06-13 NOTE — ED Provider Notes (Signed)
 [place] EMERGENCY DEPARTMENT AT [facility] Provider Note   CSN: [id number] Arrival date & time: 06/13/24  9584     Patient presents with: Shoulder Pain   Matthew [name] is a 53 y.o. male.   Presents to the Emergency Department for evaluation of right shoulder pain.  Patient reports that he fell out of the back of his tractor trailer 2 days ago.  He reports that he fell onto his right shoulder area.  He is able to move the shoulder but it hurts and he is having trouble sleeping because of the pain.  Did not hit his head or lose consciousness.  No lower arm injury.  Denies neck and back injury.       Prior to Admission medications   Medication Sig Start Date End Date Taking? Authorizing Provider  ibuprofen  (ADVIL ) 800 MG tablet Take 1 tablet (800 mg total) by mouth every 6 (six) hours as needed for moderate pain (pain score 4-6). 06/13/24  Yes Shalla Bulluck, Lonni PARAS, MD  methocarbamol  (ROBAXIN ) 500 MG tablet Take 1 tablet (500 mg total) by mouth 2 (two) times daily as needed for muscle spasms. Do not drive if taking 0/86/74  Yes Jordyan Hardiman, Lonni PARAS, MD  aspirin EC 81 MG tablet Take 81 mg by mouth daily.    [provider]  HYDROcodone -acetaminophen  (NORCO/VICODIN) 5-325 MG tablet Take 1 tablet by mouth every 4 (four) hours as needed. 01/14/19   Margrette Taft BRAVO, MD  metoprolol succinate (TOPROL-XL) 25 MG 24 hr tablet Take 25 mg by mouth daily. Take 1 tablet po daily.    [provider]    Allergies: Other    Review of Systems  Updated Vital Signs BP (!) 163/106 (BP Location: Right Arm)   Pulse 87   Temp 98.3 F (36.8 C) (Oral)   Resp 20   Ht 6' (1.829 m)   Wt 102.1 kg   SpO2 97%   BMI 30.52 kg/m   Physical Exam Vitals and nursing note reviewed.  Constitutional:      General: He is not in acute distress.    Appearance: He is well-developed.  HENT:     Head: Normocephalic and atraumatic.     Mouth/Throat:     Mouth: Mucous  membranes are moist.  Eyes:     General: Vision grossly intact. Gaze aligned appropriately.     Extraocular Movements: Extraocular movements intact.     Conjunctiva/sclera: Conjunctivae normal.  Cardiovascular:     Rate and Rhythm: Normal rate and regular rhythm.     Pulses: Normal pulses.     Heart sounds: Normal heart sounds, S1 normal and S2 normal. No murmur heard.    No friction rub. No gallop.  Pulmonary:     Effort: Pulmonary effort is normal. No respiratory distress.     Breath sounds: Normal breath sounds.  Abdominal:     Palpations: Abdomen is soft.     Tenderness: There is no abdominal tenderness. There is no guarding or rebound.     Hernia: No hernia is present.  Musculoskeletal:        General: No swelling.     Right shoulder: Tenderness present. No swelling, deformity or bony tenderness. Decreased range of motion.     Right elbow: Normal.     Right wrist: Normal.     Cervical back: Full passive range of motion without pain, normal range of motion and neck supple. No pain with movement, spinous process tenderness or muscular tenderness. Normal  range of motion.     Right lower leg: No edema.     Left lower leg: No edema.  Skin:    General: Skin is warm and dry.     Capillary Refill: Capillary refill takes less than 2 seconds.     Findings: No ecchymosis, erythema, lesion or wound.  Neurological:     Mental Status: He is alert and oriented to person, place, and time.     GCS: GCS eye subscore is 4. GCS verbal subscore is 5. GCS motor subscore is 6.     Cranial Nerves: Cranial nerves 2-12 are intact.     Sensory: Sensation is intact.     Motor: Motor function is intact. No weakness or abnormal muscle tone.     Coordination: Coordination is intact.  Psychiatric:        Mood and Affect: Mood normal.        Speech: Speech normal.        Behavior: Behavior normal.     (all labs ordered are listed, but only abnormal results are displayed) Labs Reviewed - No data to  display  EKG: None  Radiology: DG Shoulder Right Result Date: 06/13/2024 EXAM: 1 VIEW XRAY OF THE RIGHT SHOULDER 06/13/2024 04:47:35 AM COMPARISON: None available. CLINICAL HISTORY: Fall with pain. Pt with c/o R shoulder pain after he fell out of the back of his tractor trailer on Thursday. States pain no better. FINDINGS: BONES AND JOINTS: Glenohumeral joint is normally aligned. No acute fracture or dislocation. The [facility] joint is unremarkable in appearance. SOFT TISSUES: No abnormal calcifications. Visualized lung is unremarkable. IMPRESSION: 1. No acute fracture or dislocation. Electronically signed by: Waddell Calk MD 06/13/2024 05:23 AM EDT RP Workstation: [id number]     Procedures   Medications Ordered in the ED - No data to display                                  Medical Decision Making Amount and/or Complexity of Data Reviewed Radiology: ordered and independent interpretation performed. Decision-making details documented in ED Course.  Risk Prescription drug management.   Presents with shoulder pain after a fall.  No other injury.  Patient with generalized tenderness, no deformity.  Remainder of arm exam is normal.  X-ray negative.     Final diagnoses:  Fall, initial encounter  Acute pain of right shoulder    ED Discharge Orders          Ordered    ibuprofen  (ADVIL ) 800 MG tablet  Every 6 hours PRN        06/13/24 0604    methocarbamol  (ROBAXIN ) 500 MG tablet  2 times daily PRN        06/13/24 0604               Haze Lonni PARAS, MD 06/13/24 [phone]

## 2024-06-13 NOTE — ED Triage Notes (Signed)
 Pt with c/o R shoulder pain after he fell out of the back of his tractor trailer on Thursday. States pain no better.
